# Patient Record
Sex: Female | Born: 1955 | Race: White | Hispanic: No | Marital: Married | Smoking: Former smoker
Health system: Southern US, Community
[De-identification: ages and names within clinical notes are randomized; demographics above are authoritative.]

## PROBLEM LIST (undated history)

## (undated) DIAGNOSIS — E079 Disorder of thyroid, unspecified: Secondary | ICD-10-CM

## (undated) DIAGNOSIS — R5382 Chronic fatigue, unspecified: Secondary | ICD-10-CM

## (undated) DIAGNOSIS — R5383 Other fatigue: Secondary | ICD-10-CM

## (undated) DIAGNOSIS — M810 Age-related osteoporosis without current pathological fracture: Secondary | ICD-10-CM

## (undated) DIAGNOSIS — F32A Depression, unspecified: Secondary | ICD-10-CM

## (undated) DIAGNOSIS — H8109 Meniere's disease, unspecified ear: Secondary | ICD-10-CM

## (undated) DIAGNOSIS — G4700 Insomnia, unspecified: Secondary | ICD-10-CM

## (undated) DIAGNOSIS — F329 Major depressive disorder, single episode, unspecified: Secondary | ICD-10-CM

## (undated) DIAGNOSIS — N879 Dysplasia of cervix uteri, unspecified: Secondary | ICD-10-CM

## (undated) DIAGNOSIS — G43909 Migraine, unspecified, not intractable, without status migrainosus: Secondary | ICD-10-CM

## (undated) DIAGNOSIS — K635 Polyp of colon: Secondary | ICD-10-CM

## (undated) HISTORY — DX: Insomnia, unspecified: G47.00

## (undated) HISTORY — DX: Polyp of colon: K63.5

## (undated) HISTORY — PX: LEEP: SHX91

## (undated) HISTORY — DX: Dysplasia of cervix uteri, unspecified: N87.9

## (undated) HISTORY — DX: Other fatigue: R53.83

## (undated) HISTORY — PX: DENTAL SURGERY: SHX609

## (undated) HISTORY — DX: Age-related osteoporosis without current pathological fracture: M81.0

## (undated) HISTORY — DX: Chronic fatigue, unspecified: R53.82

---

## 2009-03-16 ENCOUNTER — Emergency Department (HOSPITAL_BASED_OUTPATIENT_CLINIC_OR_DEPARTMENT_OTHER): Admission: EM | Admit: 2009-03-16 | Discharge: 2009-03-16 | Payer: Self-pay | Admitting: Emergency Medicine

## 2011-05-11 ENCOUNTER — Other Ambulatory Visit (HOSPITAL_BASED_OUTPATIENT_CLINIC_OR_DEPARTMENT_OTHER): Payer: Self-pay | Admitting: Family Medicine

## 2011-05-11 DIAGNOSIS — Z1231 Encounter for screening mammogram for malignant neoplasm of breast: Secondary | ICD-10-CM

## 2011-05-13 ENCOUNTER — Ambulatory Visit (HOSPITAL_BASED_OUTPATIENT_CLINIC_OR_DEPARTMENT_OTHER)
Admission: RE | Admit: 2011-05-13 | Discharge: 2011-05-13 | Disposition: A | Discharge status: Home / Self Care | Payer: BC Managed Care – PPO | Source: Ambulatory Visit | Attending: Family Medicine | Admitting: Family Medicine

## 2011-05-13 DIAGNOSIS — Z1231 Encounter for screening mammogram for malignant neoplasm of breast: Secondary | ICD-10-CM | POA: Insufficient documentation

## 2012-01-12 ENCOUNTER — Encounter (HOSPITAL_BASED_OUTPATIENT_CLINIC_OR_DEPARTMENT_OTHER): Payer: Self-pay | Admitting: Emergency Medicine

## 2012-01-12 ENCOUNTER — Emergency Department (HOSPITAL_BASED_OUTPATIENT_CLINIC_OR_DEPARTMENT_OTHER)
Admission: EM | Admit: 2012-01-12 | Discharge: 2012-01-12 | Disposition: A | Discharge status: Home / Self Care | Payer: BC Managed Care – PPO | Attending: Emergency Medicine | Admitting: Emergency Medicine

## 2012-01-12 DIAGNOSIS — Z23 Encounter for immunization: Secondary | ICD-10-CM | POA: Insufficient documentation

## 2012-01-12 DIAGNOSIS — F3289 Other specified depressive episodes: Secondary | ICD-10-CM | POA: Insufficient documentation

## 2012-01-12 DIAGNOSIS — E079 Disorder of thyroid, unspecified: Secondary | ICD-10-CM | POA: Insufficient documentation

## 2012-01-12 DIAGNOSIS — Z87891 Personal history of nicotine dependence: Secondary | ICD-10-CM | POA: Insufficient documentation

## 2012-01-12 DIAGNOSIS — Z79899 Other long term (current) drug therapy: Secondary | ICD-10-CM | POA: Insufficient documentation

## 2012-01-12 DIAGNOSIS — F329 Major depressive disorder, single episode, unspecified: Secondary | ICD-10-CM | POA: Insufficient documentation

## 2012-01-12 DIAGNOSIS — H53149 Visual discomfort, unspecified: Secondary | ICD-10-CM | POA: Insufficient documentation

## 2012-01-12 DIAGNOSIS — H18829 Corneal disorder due to contact lens, unspecified eye: Secondary | ICD-10-CM | POA: Insufficient documentation

## 2012-01-12 HISTORY — DX: Depression, unspecified: F32.A

## 2012-01-12 HISTORY — DX: Major depressive disorder, single episode, unspecified: F32.9

## 2012-01-12 HISTORY — DX: Disorder of thyroid, unspecified: E07.9

## 2012-01-12 MED ORDER — HYDROCODONE-ACETAMINOPHEN 5-500 MG PO TABS: 1.0000 | ORAL_TABLET | Freq: Four times a day (QID) | ORAL | Status: DC | PRN

## 2012-01-12 MED ORDER — FLUORESCEIN SODIUM 1 MG OP STRP
ORAL_STRIP | OPHTHALMIC | Status: AC
  Administered 2012-01-12: 1 via OPHTHALMIC
  Filled 2012-01-12: qty 1

## 2012-01-12 MED ORDER — TETANUS-DIPHTH-ACELL PERTUSSIS 5-2.5-18.5 LF-MCG/0.5 IM SUSP
0.5000 mL | Freq: Once | INTRAMUSCULAR | Status: AC
  Administered 2012-01-12: 0.5 mL via INTRAMUSCULAR
  Filled 2012-01-12: qty 0.5

## 2012-01-12 MED ORDER — TETRACAINE HCL 0.5 % OP SOLN
1.0000 [drp] | Freq: Once | OPHTHALMIC | Status: AC
  Administered 2012-01-12: 1 [drp] via OPHTHALMIC
  Filled 2012-01-12: qty 2

## 2012-01-12 MED ORDER — CIPROFLOXACIN HCL 0.3 % OP SOLN: 2.0000 [drp] | OPHTHALMIC | Status: DC

## 2012-01-12 MED ORDER — FLUORESCEIN SODIUM 1 MG OP STRP
1.0000 | ORAL_STRIP | Freq: Once | OPHTHALMIC | Status: AC
  Administered 2012-01-12: 1 via OPHTHALMIC

## 2012-01-12 NOTE — ED Notes (Signed)
rx x 2 given for hydrocodone and cipro eye drops- note for work given per vorb from [facility] [place]

## 2012-01-12 NOTE — ED Notes (Signed)
Pt states she feels like something is in her eye, pt reports wearing contacts, took contacts out and went to bed and she awoke with severe eye pain

## 2012-01-12 NOTE — ED Provider Notes (Addendum)
History     CSN: [id number]  Arrival date & time 01/12/12  0135   First MD Initiated Contact with Patient 01/12/12 0159      Chief Complaint  Patient presents with  . Eye Pain    (Consider location/radiation/quality/duration/timing/severity/associated sxs/prior treatment) Patient is a 56 y.o. female presenting with eye pain and eye problem. The history is provided by the patient. No language interpreter was used.  Eye Pain This is a new problem. The current episode started 3 to 5 hours ago. The problem occurs constantly. The problem has not changed since onset.Pertinent negatives include no chest pain, no abdominal pain, no headaches and no shortness of breath. Nothing aggravates the symptoms. She has tried nothing for the symptoms. The treatment provided no relief.  Eye Problem  This is a new problem. The current episode started 3 to 5 hours ago. The problem occurs constantly. The problem has not changed since onset.The left eye is affected.Injury mechanism: pulled contact lens off now has pain and foreign body sensation. The pain is severe. There is no history of trauma to the eye. There is no known exposure to pink eye. She wears contacts. Associated symptoms include photophobia. Pertinent negatives include no blurred vision, no decreased vision, no discharge, no double vision, no eye redness, no nausea, no tingling and no weakness. She has tried nothing for the symptoms. The treatment provided no relief.    Past Medical History  Diagnosis Date  . Thyroid disease   . Depression     History reviewed. No pertinent past surgical history.  History reviewed. No pertinent family history.  History  Substance Use Topics  . Smoking status: Former Games developer  . Smokeless tobacco: Not on file  . Alcohol Use: No    OB History    Grav Para Term Preterm Abortions TAB SAB Ect Mult Living                  Review of Systems  Constitutional: Negative for fever.  Eyes: Positive for  photophobia and pain. Negative for blurred vision, double vision, discharge, redness and visual disturbance.  Respiratory: Negative for shortness of breath.   Cardiovascular: Negative for chest pain.  Gastrointestinal: Negative for nausea and abdominal pain.  Neurological: Negative for tingling, weakness and headaches.  All other systems reviewed and are negative.    Allergies  Erythromycin  Home Medications   Current Outpatient Rx  Name  Route  Sig  Dispense  Refill  . FLUOXETINE HCL 10 MG PO CAPS   Oral   Take 10 mg by mouth daily.         Marland Kitchen LEVOTHYROXINE SODIUM 100 MCG PO TABS   Oral   Take 100 mcg by mouth daily.           BP 148/88  Pulse 65  Temp 98.3 F (36.8 C) (Oral)  Resp 14  Ht 5\' 2"  (1.575 m)  Wt 158 lb (71.668 kg)  BMI 28.90 kg/m2  SpO2 100%  Physical Exam  Constitutional: She is oriented to person, place, and time. She appears well-developed and well-nourished. No distress.  HENT:  Head: Normocephalic and atraumatic.  Right Ear: Tympanic membrane is not injected.  Left Ear: Tympanic membrane is not injected.  Mouth/Throat: Oropharynx is clear and moist.  Eyes: Pupils are equal, round, and reactive to light. Right eye exhibits no chemosis, no discharge and no exudate. No foreign body present in the right eye. Left eye exhibits no chemosis, no discharge and no exudate.  No foreign body present in the left eye. Right conjunctiva is not injected. Right conjunctiva has no hemorrhage. Left conjunctiva is not injected. Left conjunctiva has no hemorrhage. Right eye exhibits normal extraocular motion. Left eye exhibits abnormal extraocular motion. Right pupil is reactive. Left pupil is reactive.  Slit lamp exam:      The left eye shows corneal abrasion. The left eye shows no corneal ulcer.    Neck: Normal range of motion.  Cardiovascular: Normal rate and regular rhythm.   Pulmonary/Chest: Effort normal and breath sounds normal.  Abdominal: Soft. Bowel sounds  are normal. There is no tenderness.  Musculoskeletal: Normal range of motion.  Lymphadenopathy:    She has no cervical adenopathy.  Neurological: She is alert and oriented to person, place, and time.  Skin: Skin is warm and dry.  Psychiatric: She has a normal mood and affect.    ED Course  Procedures (including critical care time)  Labs Reviewed - No data to display No results found.   No diagnosis found.    MDM  Will prescribe antibiotics follow up today with ophtholomology no contact lenses patient verbalizes understanding and agrees to follow up        Arayah Krouse K Charika Mikelson-Rasch, MD 01/12/12 0239  Lolly Glaus K Brynnlee Cumpian-Rasch, MD 01/12/12 [phone]

## 2012-01-20 LAB — HM COLONOSCOPY

## 2012-07-21 ENCOUNTER — Other Ambulatory Visit: Payer: Self-pay | Admitting: Family Medicine

## 2012-07-21 ENCOUNTER — Other Ambulatory Visit: Payer: Self-pay | Admitting: *Deleted

## 2012-07-21 DIAGNOSIS — E785 Hyperlipidemia, unspecified: Secondary | ICD-10-CM

## 2012-07-21 DIAGNOSIS — E559 Vitamin D deficiency, unspecified: Secondary | ICD-10-CM

## 2012-07-21 DIAGNOSIS — Z1231 Encounter for screening mammogram for malignant neoplasm of breast: Secondary | ICD-10-CM

## 2012-07-21 DIAGNOSIS — E039 Hypothyroidism, unspecified: Secondary | ICD-10-CM

## 2012-07-25 ENCOUNTER — Other Ambulatory Visit: Payer: BC Managed Care – PPO

## 2012-07-25 LAB — CBC WITH DIFFERENTIAL/PLATELET
Basophils Absolute: 0 10*3/uL (ref 0.0–0.1)
Basophils Relative: 1 % (ref 0–1)
Eosinophils Absolute: 0.2 10*3/uL (ref 0.0–0.7)
Eosinophils Relative: 3 % (ref 0–5)
HCT: 41.6 % (ref 36.0–46.0)
Hemoglobin: 14.7 g/dL (ref 12.0–15.0)
Lymphocytes Relative: 49 % — ABNORMAL HIGH (ref 12–46)
Lymphs Abs: 3.3 10*3/uL (ref 0.7–4.0)
MCH: 30.8 pg (ref 26.0–34.0)
MCHC: 35.3 g/dL (ref 30.0–36.0)
MCV: 87.2 fL (ref 78.0–100.0)
Monocytes Absolute: 0.5 10*3/uL (ref 0.1–1.0)
Monocytes Relative: 7 % (ref 3–12)
Neutro Abs: 2.8 10*3/uL (ref 1.7–7.7)
Neutrophils Relative %: 40 % — ABNORMAL LOW (ref 43–77)
Platelets: 348 10*3/uL (ref 150–400)
RBC: 4.77 MIL/uL (ref 3.87–5.11)
RDW: 13 % (ref 11.5–15.5)
WBC: 6.9 10*3/uL (ref 4.0–10.5)

## 2012-07-25 LAB — TSH: TSH: 0.467 u[IU]/mL (ref 0.350–4.500)

## 2012-07-25 LAB — COMPLETE METABOLIC PANEL WITH GFR
ALT: 13 U/L (ref 0–35)
AST: 15 U/L (ref 0–37)
Albumin: 4.1 g/dL (ref 3.5–5.2)
Alkaline Phosphatase: 73 U/L (ref 39–117)
BUN: 16 mg/dL (ref 6–23)
CO2: 24 mEq/L (ref 19–32)
Calcium: 9.6 mg/dL (ref 8.4–10.5)
Chloride: 106 mEq/L (ref 96–112)
Creat: 1.14 mg/dL — ABNORMAL HIGH (ref 0.50–1.10)
GFR, Est African American: 63 mL/min
GFR, Est Non African American: 54 mL/min — ABNORMAL LOW
Glucose, Bld: 105 mg/dL — ABNORMAL HIGH (ref 70–99)
Potassium: 4.4 mEq/L (ref 3.5–5.3)
Sodium: 139 mEq/L (ref 135–145)
Total Bilirubin: 0.3 mg/dL (ref 0.3–1.2)
Total Protein: 7 g/dL (ref 6.0–8.3)

## 2012-07-25 LAB — LIPID PANEL
Cholesterol: 171 mg/dL (ref 0–200)
HDL: 48 mg/dL (ref >39)
LDL Cholesterol: 108 mg/dL — ABNORMAL HIGH (ref 0–99)
Total CHOL/HDL Ratio: 3.6 Ratio
Triglycerides: 75 mg/dL (ref <150)
VLDL: 15 mg/dL (ref 0–40)

## 2012-07-25 LAB — T4, FREE: Free T4: 1.64 ng/dL (ref 0.80–1.80)

## 2012-07-26 LAB — VITAMIN D 25 HYDROXY (VIT D DEFICIENCY, FRACTURES): Vit D, 25-Hydroxy: 33 ng/mL (ref 30–89)

## 2012-07-27 ENCOUNTER — Ambulatory Visit (INDEPENDENT_AMBULATORY_CARE_PROVIDER_SITE_OTHER): Payer: BC Managed Care – PPO | Admitting: Family Medicine

## 2012-07-27 ENCOUNTER — Encounter: Payer: Self-pay | Admitting: Family Medicine

## 2012-07-27 VITALS — BP 120/77 | HR 73 | Wt 165.0 lb

## 2012-07-27 DIAGNOSIS — E785 Hyperlipidemia, unspecified: Secondary | ICD-10-CM

## 2012-07-27 DIAGNOSIS — F3289 Other specified depressive episodes: Secondary | ICD-10-CM

## 2012-07-27 DIAGNOSIS — K219 Gastro-esophageal reflux disease without esophagitis: Secondary | ICD-10-CM

## 2012-07-27 DIAGNOSIS — E039 Hypothyroidism, unspecified: Secondary | ICD-10-CM

## 2012-07-27 DIAGNOSIS — F329 Major depressive disorder, single episode, unspecified: Secondary | ICD-10-CM

## 2012-07-27 DIAGNOSIS — IMO0001 Reserved for inherently not codable concepts without codable children: Secondary | ICD-10-CM

## 2012-07-27 DIAGNOSIS — R7301 Impaired fasting glucose: Secondary | ICD-10-CM

## 2012-07-27 DIAGNOSIS — F411 Generalized anxiety disorder: Secondary | ICD-10-CM

## 2012-07-27 MED ORDER — LEVOTHYROXINE SODIUM 150 MCG PO TABS: 150.0000 ug | ORAL_TABLET | Freq: Every day | ORAL | Status: AC

## 2012-07-27 MED ORDER — BUPROPION HCL ER (SR) 150 MG PO TB12: 150.0000 mg | ORAL_TABLET | Freq: Two times a day (BID) | ORAL | Status: AC

## 2012-07-27 MED ORDER — HYDROXYZINE PAMOATE 25 MG PO CAPS: ORAL_CAPSULE | ORAL | Status: DC

## 2012-07-27 MED ORDER — FLUOXETINE HCL 10 MG PO CAPS: 20.0000 mg | ORAL_CAPSULE | Freq: Every day | ORAL | Status: DC

## 2012-07-27 MED ORDER — CYCLOBENZAPRINE HCL 10 MG PO TABS: ORAL_TABLET | ORAL | Status: AC

## 2012-07-27 MED ORDER — RANITIDINE HCL 150 MG PO TABS: 150.0000 mg | ORAL_TABLET | Freq: Two times a day (BID) | ORAL | Status: DC

## 2012-07-27 MED ORDER — METFORMIN HCL ER 500 MG PO TB24: ORAL_TABLET | ORAL | Status: DC

## 2012-07-27 NOTE — Patient Instructions (Addendum)
1)  Fatigue - Your thyroid level is perfect so stay on the 150 mcg. Add some Wellbutrin SR 150 in the am for 1 week then increase to 150 twice a day (remember to not take it too late it may keep you up).  2)  Blood Sugar - It is a little elevated. You can try the Metformin 500 -1000 mg at night.    3)  Mood - Stay on the 20 mg of Prozac and see how you feel with the Wellbutrin.  4)  50 Shades of Grey :)  Get Loraine Leriche to listen to it with you !!   5) Cholesterol - Your LDL is a little high BUT much better than before.  Try the smoothie (Vanilla Protein Powder, Almond/Skim/Soy milk, fruit and veggie cubes and avocado)   Fatigue Fatigue is a feeling of tiredness, lack of energy, lack of motivation, or feeling tired all the time. Having enough rest, good nutrition, and reducing stress will normally reduce fatigue. Consult your caregiver if it persists. The nature of your fatigue will help your caregiver to find out its cause. The treatment is based on the cause.  CAUSES  There are many causes for fatigue. Most of the time, fatigue can be traced to one or more of your habits or routines. Most causes fit into one or more of three general areas. They are: Lifestyle problems  Sleep disturbances.  Overwork.  Physical exertion.  Unhealthy habits.  Poor eating habits or eating disorders.  Alcohol and/or drug use .  Lack of proper nutrition (malnutrition). Psychological problems  Stress and/or anxiety problems.  Depression.  Grief.  Boredom. Medical Problems or Conditions  Anemia.  Pregnancy.  Thyroid gland problems.  Recovery from major surgery.  Continuous pain.  Emphysema or asthma that is not well controlled  Allergic conditions.  Diabetes.  Infections (such as mononucleosis).  Obesity.  Sleep disorders, such as sleep apnea.  Heart failure or other heart-related problems.  Cancer.  Kidney disease.  Liver disease.  Effects of certain medicines such as  antihistamines, cough and cold remedies, prescription pain medicines, heart and blood pressure medicines, drugs used for treatment of cancer, and some antidepressants. SYMPTOMS  The symptoms of fatigue include:   Lack of energy.  Lack of drive (motivation).  Drowsiness.  Feeling of indifference to the surroundings. DIAGNOSIS  The details of how you feel help guide your caregiver in finding out what is causing the fatigue. You will be asked about your present and past health condition. It is important to review all medicines that you take, including prescription and non-prescription items. A thorough exam will be done. You will be questioned about your feelings, habits, and normal lifestyle. Your caregiver may suggest blood tests, urine tests, or other tests to look for common medical causes of fatigue.  TREATMENT  Fatigue is treated by correcting the underlying cause. For example, if you have continuous pain or depression, treating these causes will improve how you feel. Similarly, adjusting the dose of certain medicines will help in reducing fatigue.  HOME CARE INSTRUCTIONS   Try to get the required amount of good sleep every night.  Eat a healthy and nutritious diet, and drink enough water throughout the day.  Practice ways of relaxing (including yoga or meditation).  Exercise regularly.  Make plans to change situations that cause stress. Act on those plans so that stresses decrease over time. Keep your work and personal routine reasonable.  Avoid street drugs and minimize use of alcohol.  Start taking a daily multivitamin after consulting your caregiver. SEEK MEDICAL CARE IF:   You have persistent tiredness, which cannot be accounted for.  You have fever.  You have unintentional weight loss.  You have headaches.  You have disturbed sleep throughout the night.  You are feeling sad.  You have constipation.  You have dry skin.  You have gained weight.  You are taking  any new or different medicines that you suspect are causing fatigue.  You are unable to sleep at night.  You develop any unusual swelling of your legs or other parts of your body. SEEK IMMEDIATE MEDICAL CARE IF:   You are feeling confused.  Your vision is blurred.  You feel faint or pass out.  You develop severe headache.  You develop severe abdominal, pelvic, or back pain.  You develop chest pain, shortness of breath, or an irregular or fast heartbeat.  You are unable to pass a normal amount of urine.  You develop abnormal bleeding such as bleeding from the rectum or you vomit blood.  You have thoughts about harming yourself or committing suicide.  You are worried that you might harm someone else. MAKE SURE YOU:   Understand these instructions.  Will watch your condition.  Will get help right away if you are not doing well or get worse. Document Released: 11/02/2006 Document Revised: 03/30/2011 Document Reviewed: 11/02/2006 [facility] Patient Information 2014 [place], [place].

## 2012-07-27 NOTE — Progress Notes (Signed)
  Subjective:    Patient ID: Jodi Jones, female    DOB: Mar 19, 1955, 57 y.o.   MRN: [id number]  HPI  Jodi Jones is here to follow up on her labs, get medication refills and discuss the following conditions listed below:  1)  Hypothyroidism:  She has been feeling very fatigued lately.    2)  Hyperlipidemia:  She has been trying to control her cholesterol with diet.    3)  Mood: Her mood is stable on Prozac.   4)  GERD:  She needs to have her Zantac refilled.      Review of Systems  Constitutional: Positive for fatigue. Negative for activity change and unexpected weight change.  HENT: Negative.   Eyes: Negative.   Respiratory: Negative for shortness of breath.   Cardiovascular: Negative for chest pain, palpitations and leg swelling.  Gastrointestinal: Negative for diarrhea and constipation.  Endocrine: Negative.   Genitourinary: Negative for difficulty urinating.  Musculoskeletal: Negative.   Skin: Negative.   Neurological: Negative.   Hematological: Negative for adenopathy. Does not bruise/bleed easily.  Psychiatric/Behavioral: Negative for sleep disturbance and dysphoric mood. The patient is not nervous/anxious.    Past Medical History  Diagnosis Date  . Thyroid disease   . Depression   . Fatigue   . Chronic fatigue   . Insomnia   . Colon polyps   . Cervical dysplasia   . Osteoporosis    Family History  Problem Relation Age of Onset  . Alzheimer's disease Mother   . Alzheimer's disease Paternal Grandfather    History   Social History Narrative   Marital Status: Married Government social research officer)   Children:  G 3 P2012   Pets: Cats (3)   Living Situation: Lives with her spouse, daughter, and father- in- law     Occupation:  [employer]    Education: Engineer, agricultural; She is currently enrolled in a math class at [employer].     Tobacco Use/Exposure:  None    Alcohol Use:  Occasional   Drug Use:  None   Diet:  Regular   Exercise:  Limited    Hobbies:   Gardening, Knitting, Crocheting                  Objective:   Physical Exam  Constitutional: She appears well-nourished. No distress.  HENT:  Head: Normocephalic.  Eyes: No scleral icterus.  Neck: No thyromegaly present.  Cardiovascular: Normal rate, regular rhythm and normal heart sounds.   Pulmonary/Chest: Effort normal and breath sounds normal.  Abdominal: There is no tenderness.  Musculoskeletal: She exhibits no edema and no tenderness.  Neurological: She is alert.  Skin: Skin is warm and dry.  Psychiatric: She has a normal mood and affect. Her behavior is normal. Judgment and thought content normal.          Assessment & Plan:

## 2012-08-09 ENCOUNTER — Other Ambulatory Visit: Payer: BC Managed Care – PPO | Admitting: Family Medicine

## 2012-08-10 ENCOUNTER — Encounter: Payer: Self-pay | Admitting: Family Medicine

## 2012-08-10 ENCOUNTER — Ambulatory Visit (INDEPENDENT_AMBULATORY_CARE_PROVIDER_SITE_OTHER): Payer: BC Managed Care – PPO | Admitting: Family Medicine

## 2012-08-10 VITALS — BP 115/78 | HR 87 | Temp 98.3°F | Resp 17 | Ht 61.5 in | Wt 161.0 lb

## 2012-08-10 DIAGNOSIS — R05 Cough: Secondary | ICD-10-CM

## 2012-08-10 DIAGNOSIS — IMO0001 Reserved for inherently not codable concepts without codable children: Secondary | ICD-10-CM

## 2012-08-10 DIAGNOSIS — R509 Fever, unspecified: Secondary | ICD-10-CM

## 2012-08-10 DIAGNOSIS — R059 Cough, unspecified: Secondary | ICD-10-CM

## 2012-08-10 DIAGNOSIS — R070 Pain in throat: Secondary | ICD-10-CM

## 2012-08-10 LAB — CBC WITH DIFFERENTIAL/PLATELET
Basophils Absolute: 0 10*3/uL (ref 0.0–0.1)
Basophils Relative: 1 % (ref 0–1)
Eosinophils Absolute: 0.2 10*3/uL (ref 0.0–0.7)
Eosinophils Relative: 2 % (ref 0–5)
HCT: 44.4 % (ref 36.0–46.0)
Hemoglobin: 15.3 g/dL — ABNORMAL HIGH (ref 12.0–15.0)
Lymphocytes Relative: 41 % (ref 12–46)
Lymphs Abs: 2.8 10*3/uL (ref 0.7–4.0)
MCH: 31 pg (ref 26.0–34.0)
MCHC: 34.5 g/dL (ref 30.0–36.0)
MCV: 90.1 fL (ref 78.0–100.0)
Monocytes Absolute: 0.4 10*3/uL (ref 0.1–1.0)
Monocytes Relative: 6 % (ref 3–12)
Neutro Abs: 3.4 10*3/uL (ref 1.7–7.7)
Neutrophils Relative %: 50 % (ref 43–77)
Platelets: 369 10*3/uL (ref 150–400)
RBC: 4.93 MIL/uL (ref 3.87–5.11)
RDW: 12.8 % (ref 11.5–15.5)
WBC: 6.9 10*3/uL (ref 4.0–10.5)

## 2012-08-10 LAB — POCT RAPID STREP A (OFFICE): Rapid Strep A Screen: NEGATIVE

## 2012-08-10 MED ORDER — HYDROCODONE-HOMATROPINE 5-1.5 MG/5ML PO SYRP: ORAL_SOLUTION | ORAL | Status: DC

## 2012-08-10 MED ORDER — BENZONATATE 200 MG PO CAPS: 200.0000 mg | ORAL_CAPSULE | Freq: Three times a day (TID) | ORAL | Status: DC | PRN

## 2012-08-10 MED ORDER — CEFDINIR 300 MG PO CAPS: 300.0000 mg | ORAL_CAPSULE | Freq: Two times a day (BID) | ORAL | Status: DC

## 2012-08-10 NOTE — Progress Notes (Signed)
  Subjective:    Patient ID: Jodi Jones, female    DOB: 07/10/1955, 57 y.o.   MRN: [id number]   HPI  Jodi Jones is here today complaining of multiple symptoms. She has been feeling poorly for a couple of days.  She has a severe sore throat, head congestion, back pain and generalized muscle aches.  She feels like she might have the flu.  She describes her symptoms as being moderate in nature and she feels that she is worsening.  She has been taking an antihistamine.     Review of Systems  Constitutional: Positive for fever, chills, diaphoresis and fatigue.  HENT: Positive for ear pain, congestion, sore throat, rhinorrhea, voice change and sinus pressure.   Eyes: Negative for pain.  Respiratory: Positive for cough and chest tightness. Negative for shortness of breath.   Cardiovascular: Negative.   Gastrointestinal: Negative.   Genitourinary: Negative for difficulty urinating.  Musculoskeletal: Positive for myalgias and back pain.  Skin: Negative for rash.  Neurological: Negative.   Psychiatric/Behavioral: Negative.    Past Medical History  Diagnosis Date  . Thyroid disease   . Depression   . Fatigue    Family History  Problem Relation Age of Onset  . Alzheimer's disease Mother   . Alzheimer's disease Paternal Grandfather     History   Social History Narrative   Marital Status: Married Government social research officer)   Children:  G 3 P2012   Pets: Cats (3)   Living Situation: Lives with her spouse, daughter, and father- in- law     Occupation:  [employer]    Education: Engineer, agricultural; She is currently enrolled in a math class at [employer].     Tobacco Use/Exposure:  None    Alcohol Use:  Occasional   Drug Use:  None   Diet:  Regular   Exercise:  Limited    Hobbies:   Gardening, Knitting, Crocheting                   Objective:   Physical Exam  Constitutional:  Ill-appearing  HENT:  Mouth/Throat: No oropharyngeal exudate.  Eyes: Conjunctivae are normal. No scleral icterus.  Neck: Neck  supple. No thyromegaly present.  Cardiovascular: Normal rate, regular rhythm and normal heart sounds.   Pulmonary/Chest: Effort normal and breath sounds normal. No respiratory distress.  Abdominal: Soft. She exhibits no mass. There is no tenderness.  Lymphadenopathy:    She has no cervical adenopathy.  Neurological: She is alert.  Skin: No rash noted.  Psychiatric: She has a normal mood and affect. Her behavior is normal. Judgment and thought content normal.          Assessment & Plan:

## 2012-08-12 LAB — URINE CULTURE: Colony Count: >=100000

## 2012-08-28 ENCOUNTER — Encounter: Payer: Self-pay | Admitting: Family Medicine

## 2012-08-28 DIAGNOSIS — F411 Generalized anxiety disorder: Secondary | ICD-10-CM | POA: Insufficient documentation

## 2012-08-28 DIAGNOSIS — E785 Hyperlipidemia, unspecified: Secondary | ICD-10-CM | POA: Insufficient documentation

## 2012-08-28 DIAGNOSIS — IMO0001 Reserved for inherently not codable concepts without codable children: Secondary | ICD-10-CM | POA: Insufficient documentation

## 2012-08-28 DIAGNOSIS — F3289 Other specified depressive episodes: Secondary | ICD-10-CM | POA: Insufficient documentation

## 2012-08-28 DIAGNOSIS — R7301 Impaired fasting glucose: Secondary | ICD-10-CM | POA: Insufficient documentation

## 2012-08-28 DIAGNOSIS — F329 Major depressive disorder, single episode, unspecified: Secondary | ICD-10-CM | POA: Insufficient documentation

## 2012-08-28 DIAGNOSIS — K219 Gastro-esophageal reflux disease without esophagitis: Secondary | ICD-10-CM | POA: Insufficient documentation

## 2012-08-28 DIAGNOSIS — E039 Hypothyroidism, unspecified: Secondary | ICD-10-CM | POA: Insufficient documentation

## 2012-08-28 NOTE — Assessment & Plan Note (Addendum)
Her FBS is elevated.  She will start on a small dosage of metformin.  She will also work on her diet and exercise.

## 2012-08-28 NOTE — Assessment & Plan Note (Signed)
She was given prescriptions for Voltaren Gel, Flexeril and Lidoderm Patch.

## 2012-08-28 NOTE — Assessment & Plan Note (Signed)
We'll add some Wellbutrin to help her mood and her energy.

## 2012-08-28 NOTE — Assessment & Plan Note (Signed)
Refilled her Zantac.

## 2012-08-28 NOTE — Assessment & Plan Note (Signed)
Refilled her levothyroxine.

## 2012-08-28 NOTE — Assessment & Plan Note (Signed)
Refilled her Prozac and Vistaril.

## 2012-08-28 NOTE — Assessment & Plan Note (Signed)
She will try the smoothie we discussed to lower her LDL.

## 2012-08-31 ENCOUNTER — Ambulatory Visit (HOSPITAL_BASED_OUTPATIENT_CLINIC_OR_DEPARTMENT_OTHER)
Admission: RE | Admit: 2012-08-31 | Discharge: 2012-08-31 | Disposition: A | Discharge status: Home / Self Care | Payer: BC Managed Care – PPO | Source: Ambulatory Visit | Attending: Family Medicine | Admitting: Family Medicine

## 2012-08-31 DIAGNOSIS — Z1231 Encounter for screening mammogram for malignant neoplasm of breast: Secondary | ICD-10-CM | POA: Insufficient documentation

## 2012-08-31 IMAGING — MG MM DIGITAL SCREENING
4 series · 4 of 4 positions shown · non-contrast
Comparison: Previous exam(s).

CLINICAL DATA: Screening.

DIGITAL SCREENING BILATERAL MAMMOGRAM WITH CAD

[R CC]
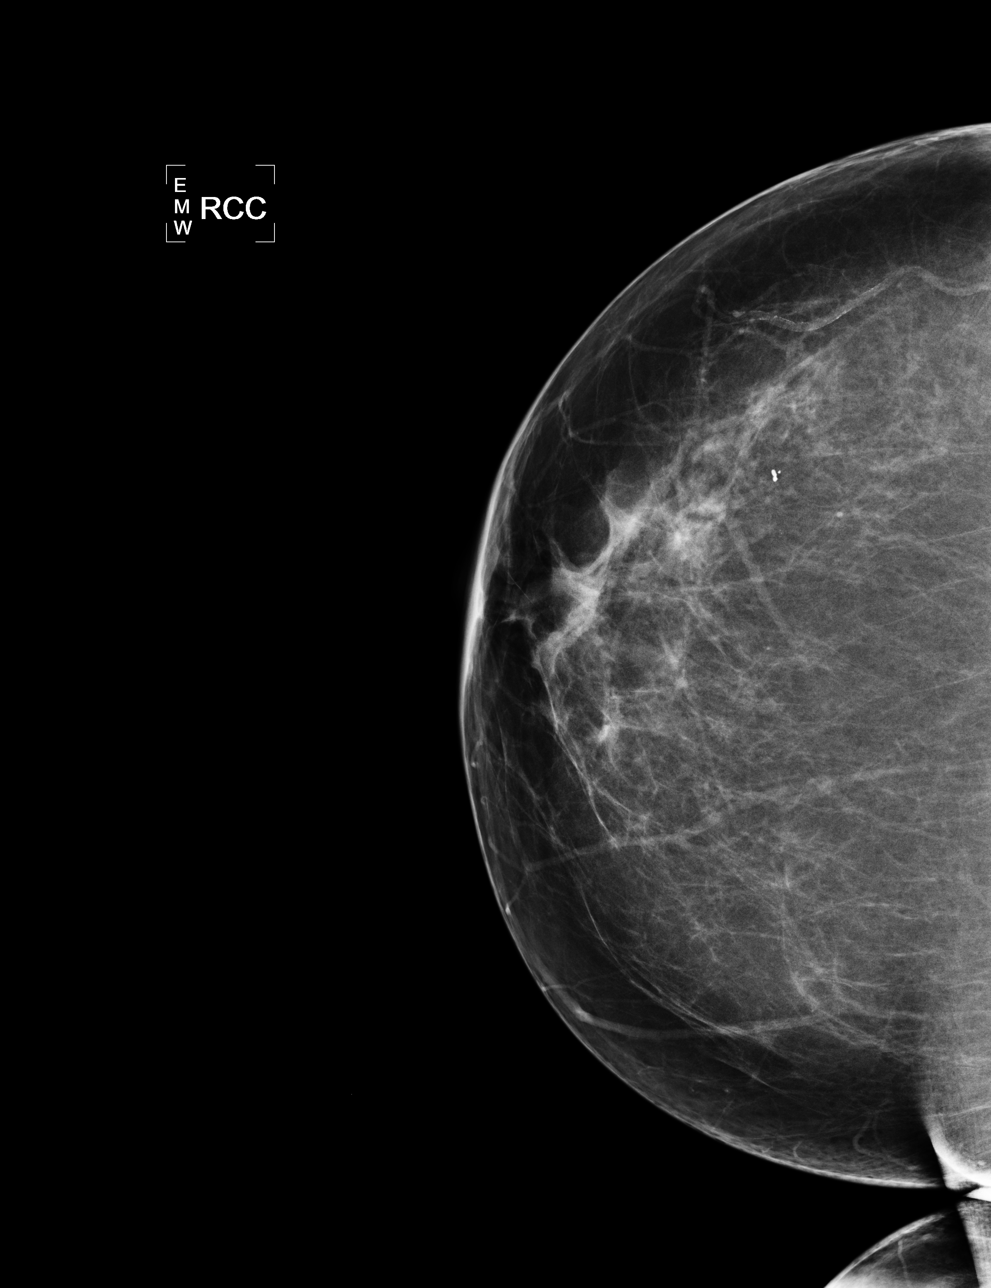

[L CC]
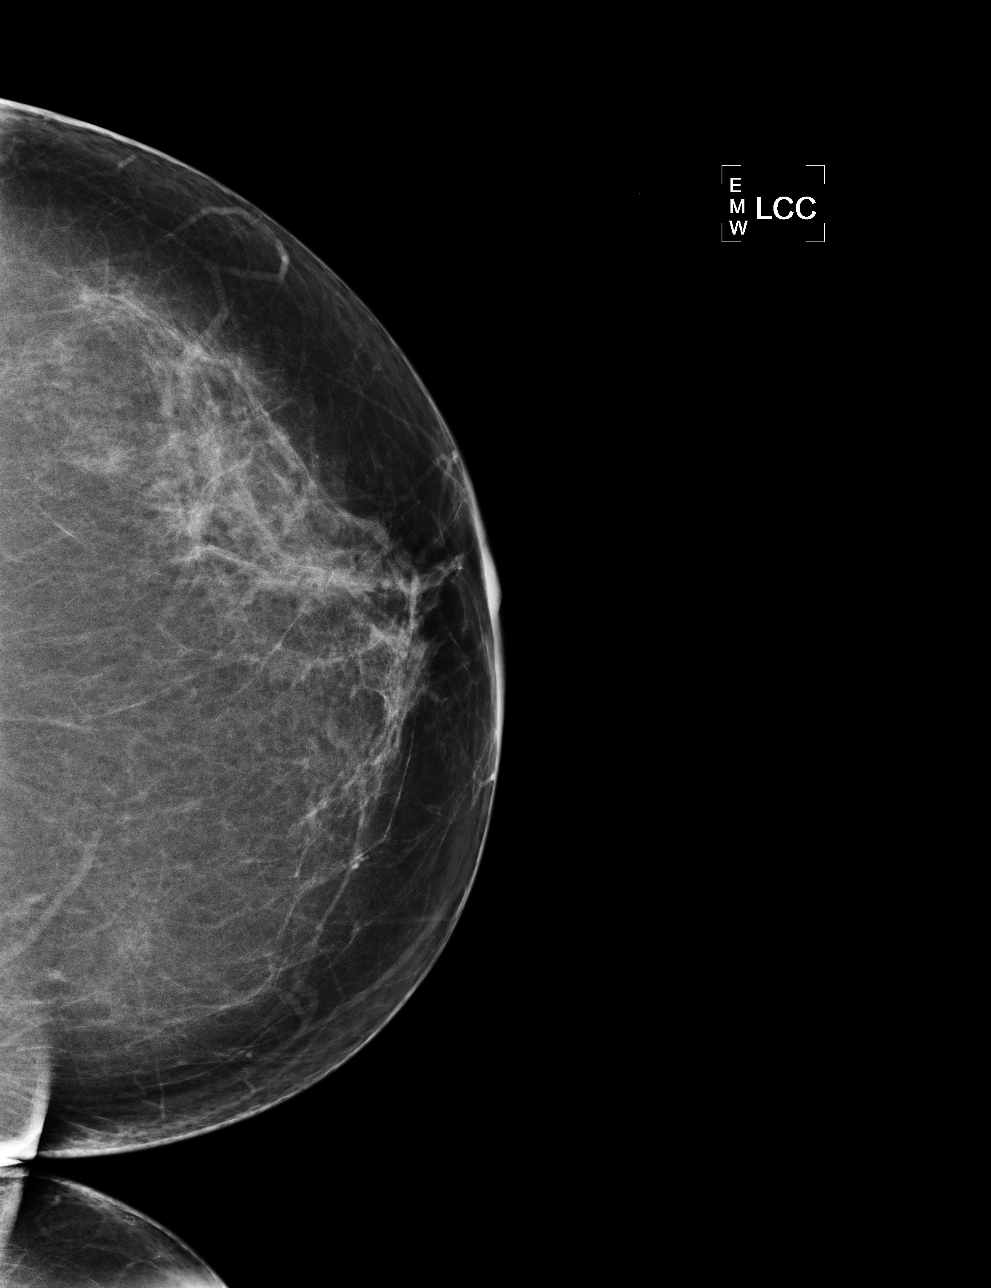

[L MLO]
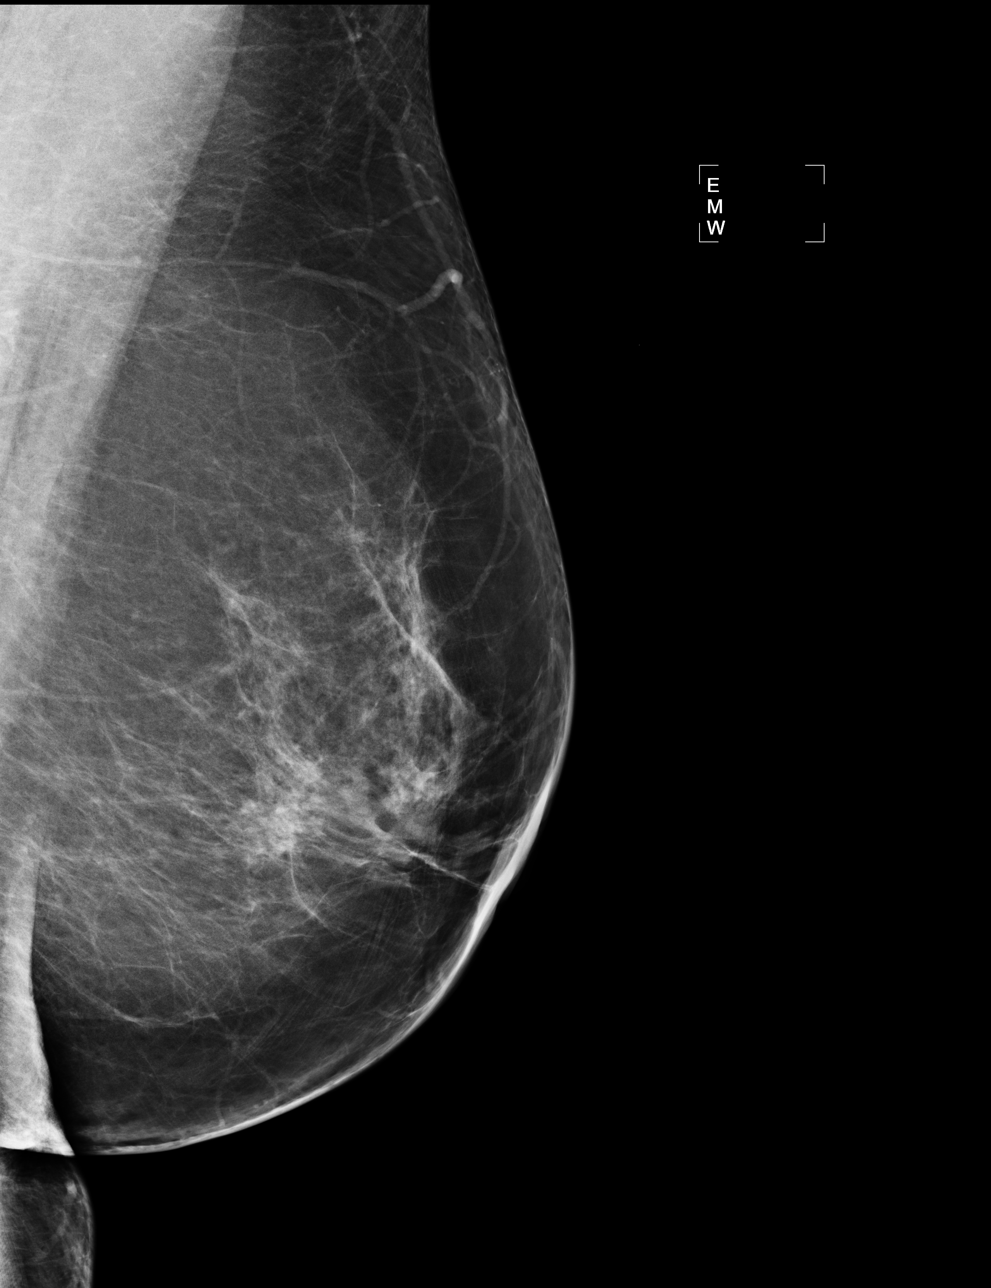

[R MLO]
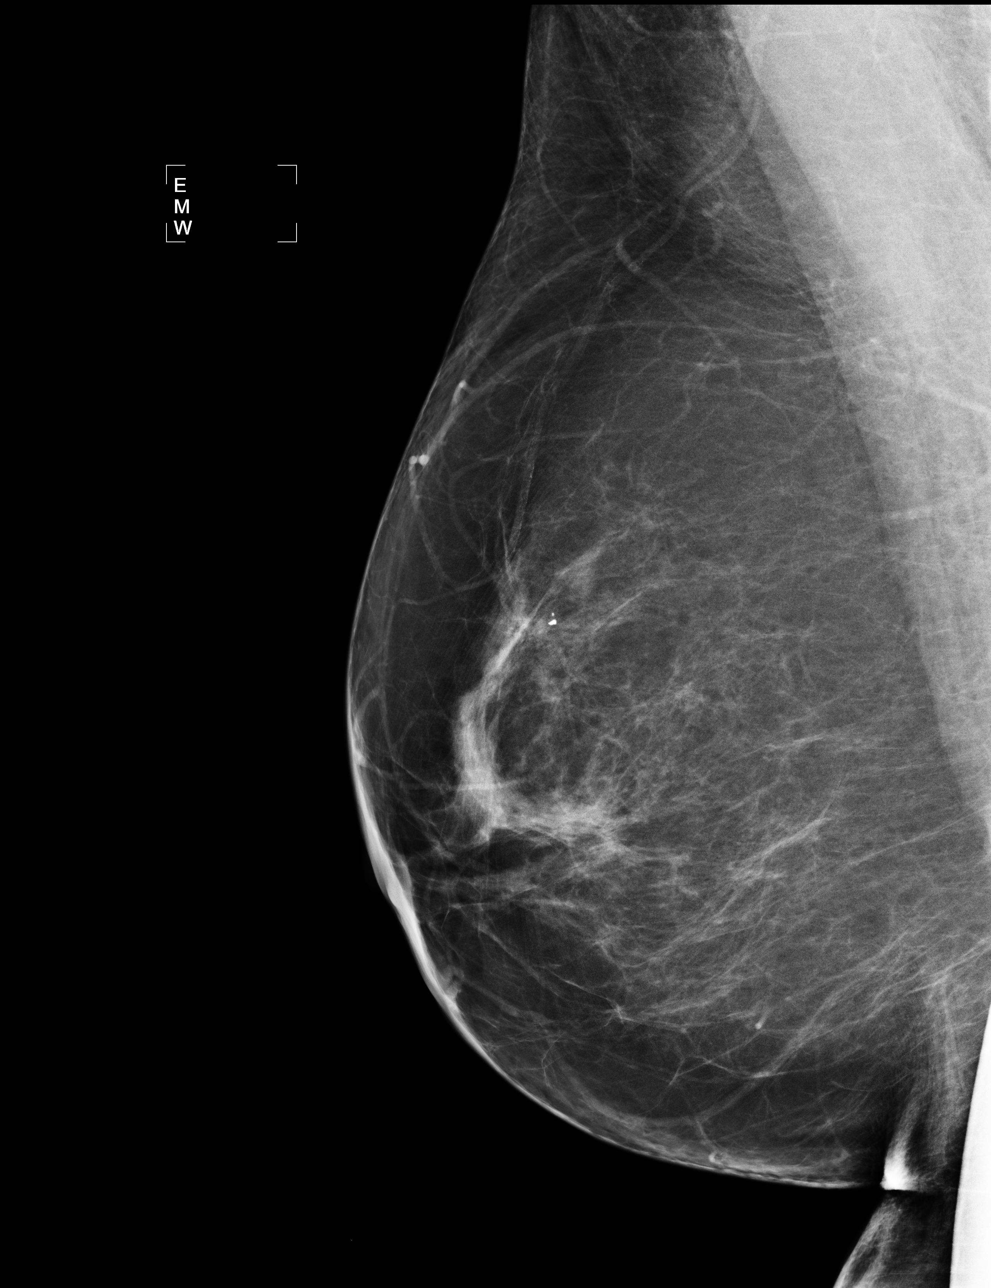

[4 of 4 positions shown; findings below may reference images not displayed]

FINDINGS: ACR Breast Density Category b:  There are scattered areas of
fibroglandular density.

There are no findings suspicious for malignancy.

Images were processed with CAD.
IMPRESSION: No mammographic evidence of malignancy.

A result letter of this screening mammogram will be mailed directly
to the patient.

RECOMMENDATION:
Screening mammogram in one year. (Code:[C3])

BI-RADS CATEGORY 1:  Negative.

## 2012-09-06 ENCOUNTER — Ambulatory Visit: Payer: BC Managed Care – PPO | Admitting: Family Medicine

## 2012-09-24 ENCOUNTER — Encounter: Payer: Self-pay | Admitting: Family Medicine

## 2012-09-24 DIAGNOSIS — R509 Fever, unspecified: Secondary | ICD-10-CM | POA: Insufficient documentation

## 2012-09-24 DIAGNOSIS — R070 Pain in throat: Secondary | ICD-10-CM | POA: Insufficient documentation

## 2012-09-24 DIAGNOSIS — R059 Cough, unspecified: Secondary | ICD-10-CM | POA: Insufficient documentation

## 2012-09-24 DIAGNOSIS — R05 Cough: Secondary | ICD-10-CM | POA: Insufficient documentation

## 2012-09-24 NOTE — Assessment & Plan Note (Signed)
She was given prescriptions for Tessalon Perles and Hycodan for her cough.   

## 2012-09-24 NOTE — Assessment & Plan Note (Signed)
Sent off a urine culture; Checking a CBC; Treated with Truman Hayward

## 2012-09-24 NOTE — Assessment & Plan Note (Signed)
Rapid Strep was negative.   

## 2012-10-13 ENCOUNTER — Encounter: Payer: BC Managed Care – PPO | Admitting: Family Medicine

## 2012-11-10 ENCOUNTER — Other Ambulatory Visit: Payer: Self-pay | Admitting: Family Medicine

## 2012-11-17 ENCOUNTER — Encounter: Payer: BC Managed Care – PPO | Admitting: Family Medicine

## 2012-12-09 ENCOUNTER — Encounter: Payer: BC Managed Care – PPO | Admitting: Family Medicine

## 2013-02-08 ENCOUNTER — Other Ambulatory Visit: Payer: BC Managed Care – PPO

## 2013-02-14 ENCOUNTER — Other Ambulatory Visit: Payer: Self-pay | Admitting: *Deleted

## 2013-02-14 DIAGNOSIS — E039 Hypothyroidism, unspecified: Secondary | ICD-10-CM

## 2013-02-14 DIAGNOSIS — Z0000 Encounter for general adult medical examination without abnormal findings: Secondary | ICD-10-CM

## 2013-02-14 DIAGNOSIS — E785 Hyperlipidemia, unspecified: Secondary | ICD-10-CM

## 2013-02-14 DIAGNOSIS — E559 Vitamin D deficiency, unspecified: Secondary | ICD-10-CM

## 2013-02-15 ENCOUNTER — Other Ambulatory Visit (INDEPENDENT_AMBULATORY_CARE_PROVIDER_SITE_OTHER): Payer: BC Managed Care – PPO

## 2013-02-15 LAB — COMPLETE METABOLIC PANEL WITH GFR
ALT: 13 U/L (ref 0–35)
AST: 14 U/L (ref 0–37)
Albumin: 4 g/dL (ref 3.5–5.2)
Alkaline Phosphatase: 69 U/L (ref 39–117)
BUN: 12 mg/dL (ref 6–23)
CO2: 26 mEq/L (ref 19–32)
Calcium: 9.2 mg/dL (ref 8.4–10.5)
Chloride: 105 mEq/L (ref 96–112)
Creat: 0.99 mg/dL (ref 0.50–1.10)
GFR, Est African American: 74 mL/min
GFR, Est Non African American: 64 mL/min
Glucose, Bld: 96 mg/dL (ref 70–99)
Potassium: 4.7 mEq/L (ref 3.5–5.3)
Sodium: 139 mEq/L (ref 135–145)
Total Bilirubin: 0.5 mg/dL (ref 0.2–1.2)
Total Protein: 7 g/dL (ref 6.0–8.3)

## 2013-02-15 LAB — CBC WITH DIFFERENTIAL/PLATELET
Basophils Absolute: 0.1 10*3/uL (ref 0.0–0.1)
Basophils Relative: 1 % (ref 0–1)
Eosinophils Absolute: 0.1 10*3/uL (ref 0.0–0.7)
Eosinophils Relative: 2 % (ref 0–5)
HCT: 42.1 % (ref 36.0–46.0)
Hemoglobin: 14.9 g/dL (ref 12.0–15.0)
Lymphocytes Relative: 50 % — ABNORMAL HIGH (ref 12–46)
Lymphs Abs: 3 10*3/uL (ref 0.7–4.0)
MCH: 31.3 pg (ref 26.0–34.0)
MCHC: 35.4 g/dL (ref 30.0–36.0)
MCV: 88.4 fL (ref 78.0–100.0)
Monocytes Absolute: 0.4 10*3/uL (ref 0.1–1.0)
Monocytes Relative: 7 % (ref 3–12)
Neutro Abs: 2.5 10*3/uL (ref 1.7–7.7)
Neutrophils Relative %: 40 % — ABNORMAL LOW (ref 43–77)
Platelets: 346 10*3/uL (ref 150–400)
RBC: 4.76 MIL/uL (ref 3.87–5.11)
RDW: 12.6 % (ref 11.5–15.5)
WBC: 6.1 10*3/uL (ref 4.0–10.5)

## 2013-02-15 LAB — LIPID PANEL
Cholesterol: 187 mg/dL (ref 0–200)
HDL: 41 mg/dL (ref >39)
LDL Cholesterol: 128 mg/dL — ABNORMAL HIGH (ref 0–99)
Total CHOL/HDL Ratio: 4.6 Ratio
Triglycerides: 88 mg/dL (ref <150)
VLDL: 18 mg/dL (ref 0–40)

## 2013-02-15 LAB — T4, FREE: Free T4: 1.46 ng/dL (ref 0.80–1.80)

## 2013-02-15 LAB — TSH: TSH: 1.098 u[IU]/mL (ref 0.350–4.500)

## 2013-02-16 LAB — VITAMIN D 25 HYDROXY (VIT D DEFICIENCY, FRACTURES): Vit D, 25-Hydroxy: 28 ng/mL — ABNORMAL LOW (ref 30–89)

## 2013-02-27 ENCOUNTER — Encounter: Payer: Self-pay | Admitting: Family Medicine

## 2013-02-27 ENCOUNTER — Encounter (INDEPENDENT_AMBULATORY_CARE_PROVIDER_SITE_OTHER): Payer: Self-pay

## 2013-02-27 ENCOUNTER — Ambulatory Visit (INDEPENDENT_AMBULATORY_CARE_PROVIDER_SITE_OTHER): Payer: BC Managed Care – PPO | Admitting: Family Medicine

## 2013-02-27 VITALS — BP 127/75 | HR 74 | Resp 16 | Ht 62.0 in | Wt 168.0 lb

## 2013-02-27 DIAGNOSIS — F411 Generalized anxiety disorder: Secondary | ICD-10-CM

## 2013-02-27 DIAGNOSIS — F3289 Other specified depressive episodes: Secondary | ICD-10-CM

## 2013-02-27 DIAGNOSIS — F329 Major depressive disorder, single episode, unspecified: Secondary | ICD-10-CM

## 2013-02-27 DIAGNOSIS — E559 Vitamin D deficiency, unspecified: Secondary | ICD-10-CM

## 2013-02-27 MED ORDER — FLUOXETINE HCL 20 MG PO CAPS: 20.0000 mg | ORAL_CAPSULE | Freq: Every day | ORAL | Status: DC

## 2013-02-27 MED ORDER — VITAMIN D (ERGOCALCIFEROL) 1.25 MG (50000 UNIT) PO CAPS: ORAL_CAPSULE | ORAL | Status: AC

## 2013-02-27 MED ORDER — HYDROXYZINE PAMOATE 25 MG PO CAPS: ORAL_CAPSULE | ORAL | Status: AC

## 2013-02-27 MED ORDER — BUPROPION HCL ER (XL) 300 MG PO TB24: 300.0000 mg | ORAL_TABLET | Freq: Every day | ORAL | Status: DC

## 2013-02-27 NOTE — Progress Notes (Signed)
Subjective:    Patient ID: Elmer Balesindy W Pennock, female    DOB: 1955-12-20, 58 y.o.   MRN: [id number]  HPI  [name] is here today to go over her most recent lab results and to discuss the conditions listed below:   1)  Hypothyroidism - She is doing well on her current dosage of Levothyroxine.  She gets it filled at work.    2)  Mood - She ran out of her Wellbutrin and has not taken it for several days.  She also takes Vistaril occasionally and needs a refill on it.    3)  IFG - She was not sure she had to take Metformin for a longer period.  She only took it for 30 days.    Review of Systems  Constitutional: Negative for appetite change and unexpected weight change.  Endocrine: Negative for heat intolerance.  Musculoskeletal: Negative for arthralgias and neck pain.  Psychiatric/Behavioral: The patient is not nervous/anxious.      Past Medical History  Diagnosis Date  . Thyroid disease   . Depression   . Fatigue   . Chronic fatigue   . Insomnia   . Colon polyps   . Cervical dysplasia   . Osteoporosis      Past Surgical History  Procedure Laterality Date  . Leep    . Dental surgery      Bone Graft (2x)      History   Social History Narrative   Marital Status: Married Government social research officer(Mark)   Children:  G 3 P2012   Pets: Cats (3)   Living Situation: Lives with her spouse   Occupation: Systems developerAnalyst 1 [employer]    Education: Engineer, agriculturalHigh School Graduate; She is currently enrolled in a [facility] class at [employer].     Tobacco Use/Exposure:  None    Alcohol Use:  Occasional   Drug Use:  None   Diet:  Regular   Exercise:  Limited    Hobbies:   Gardening, Knitting, Crocheting                  Family History  Problem Relation Age of Onset  . Alzheimer's disease Mother   . Alzheimer's disease Paternal Grandfather   . COPD Father      Current Outpatient Prescriptions on File Prior to Visit  Medication Sig Dispense Refill  . diclofenac sodium (VOLTAREN) 1 % GEL Apply 4 g topically 4 (four) times  daily.      Marland Kitchen. levothyroxine (SYNTHROID, LEVOTHROID) 150 MCG tablet Take 1 tablet (150 mcg total) by mouth daily before breakfast.  90 tablet  3  . ranitidine (ZANTAC) 150 MG tablet Take 1 tablet (150 mg total) by mouth 2 (two) times daily.  180 tablet  3  . buPROPion (WELLBUTRIN SR) 150 MG 12 hr tablet Take 1 tablet (150 mg total) by mouth 2 (two) times daily.  60 tablet  1  . cyclobenzaprine (FLEXERIL) 10 MG tablet Take 1 tab po q hs for muscle spasm  90 tablet  3  . lidocaine (LIDODERM) 5 % Place 1 patch onto the skin daily. Remove & Discard patch within 12 hours or as directed by MD      . metFORMIN (GLUCOPHAGE XR) 500 MG 24 hr tablet Take up to 2 tabs at night  60 tablet  5   No current facility-administered medications on file prior to visit.     Allergies  Allergen Reactions  . Erythromycin     Lightheadeness  Immunization History  Administered Date(s) Administered  . Tdap 01/12/2012      Objective:   Physical Exam  Vitals reviewed. Constitutional: She is oriented to person, place, and time.  Eyes: Conjunctivae are normal. No scleral icterus.  Neck: Neck supple. No thyromegaly present.  Cardiovascular: Normal rate, regular rhythm and normal heart sounds.   Pulmonary/Chest: Effort normal and breath sounds normal.  Musculoskeletal: She exhibits no edema and no tenderness.  Lymphadenopathy:    She has no cervical adenopathy.  Neurological: She is alert and oriented to person, place, and time.  Skin: Skin is warm and dry.  Psychiatric: She has a normal mood and affect. Her behavior is normal. Judgment and thought content normal.      Assessment & Plan:    Nely was seen today for medication management.  Diagnoses and associated orders for this visit:  Depressive disorder, not elsewhere classified - buPROPion (WELLBUTRIN XL) 300 MG 24 hr tablet; Take 1 tablet (300 mg total) by mouth daily. - FLUoxetine (PROZAC) 20 MG capsule; Take 1 capsule (20 mg total) by mouth  daily.  Anxiety state, unspecified - hydrOXYzine (VISTARIL) 25 MG capsule; Take 1 capsule po as needed for increased anxiety  Unspecified vitamin D deficiency - Vitamin D, Ergocalciferol, (DRISDOL) 50000 UNITS CAPS capsule; Take 1 capsule po twice a week   TIME SPENT "FACE TO FACE" WITH PATIENT -  30 MINS

## 2013-03-08 ENCOUNTER — Other Ambulatory Visit: Payer: Self-pay | Admitting: Family Medicine

## 2013-03-24 ENCOUNTER — Ambulatory Visit (INDEPENDENT_AMBULATORY_CARE_PROVIDER_SITE_OTHER): Payer: BC Managed Care – PPO | Admitting: Family Medicine

## 2013-03-24 ENCOUNTER — Other Ambulatory Visit (HOSPITAL_COMMUNITY)
Admission: RE | Admit: 2013-03-24 | Discharge: 2013-03-24 | Disposition: A | Discharge status: Home / Self Care | Payer: BC Managed Care – PPO | Source: Ambulatory Visit | Attending: Family Medicine | Admitting: Family Medicine

## 2013-03-24 ENCOUNTER — Encounter: Payer: Self-pay | Admitting: Family Medicine

## 2013-03-24 VITALS — BP 115/81 | HR 80 | Resp 16 | Ht 62.0 in | Wt 170.0 lb

## 2013-03-24 DIAGNOSIS — Z124 Encounter for screening for malignant neoplasm of cervix: Secondary | ICD-10-CM

## 2013-03-24 DIAGNOSIS — F3289 Other specified depressive episodes: Secondary | ICD-10-CM

## 2013-03-24 DIAGNOSIS — F329 Major depressive disorder, single episode, unspecified: Secondary | ICD-10-CM

## 2013-03-24 DIAGNOSIS — Z0000 Encounter for general adult medical examination without abnormal findings: Secondary | ICD-10-CM

## 2013-03-24 DIAGNOSIS — Z01419 Encounter for gynecological examination (general) (routine) without abnormal findings: Secondary | ICD-10-CM | POA: Insufficient documentation

## 2013-03-24 DIAGNOSIS — E669 Obesity, unspecified: Secondary | ICD-10-CM

## 2013-03-24 DIAGNOSIS — Z1151 Encounter for screening for human papillomavirus (HPV): Secondary | ICD-10-CM | POA: Insufficient documentation

## 2013-03-24 LAB — POCT URINALYSIS DIPSTICK
Bilirubin, UA: NEGATIVE
Blood, UA: NEGATIVE
Glucose, UA: NEGATIVE
Ketones, UA: NEGATIVE
Leukocytes, UA: NEGATIVE
Nitrite, UA: NEGATIVE
Protein, UA: NEGATIVE
Spec Grav, UA: 1.02
Urobilinogen, UA: NEGATIVE
pH, UA: 5

## 2013-03-24 MED ORDER — PHENTERMINE-TOPIRAMATE ER 7.5-46 MG PO CP24: 1.0000 | ORAL_CAPSULE | Freq: Every day | ORAL | Status: DC

## 2013-03-24 MED ORDER — FLUOXETINE HCL 40 MG PO CAPS: 40.0000 mg | ORAL_CAPSULE | Freq: Every day | ORAL | Status: DC

## 2013-03-24 MED ORDER — PHENTERMINE-TOPIRAMATE ER 3.75-23 MG PO CP24: 1.0000 | ORAL_CAPSULE | Freq: Every day | ORAL | Status: DC

## 2013-03-24 NOTE — Progress Notes (Signed)
Subjective:    Patient ID: Jodi Jones, female    DOB: [birth year], 58 y.o.   MRN: [id number]  HPI  Jodi Jones is here today for her annual CPE/PAP smear.  She has done well since her last office visit.  Overall she feels that her health is good except her weight.  She would like to start a new diet.  She also needs to have her Prozac refilled.     Review of Systems  Constitutional: Negative for activity change, appetite change, fatigue and unexpected weight change.  HENT: Negative for congestion, dental problem, ear pain, hearing loss, trouble swallowing and voice change.   Eyes: Negative for pain, redness and visual disturbance.  Respiratory: Negative for cough and shortness of breath.   Cardiovascular: Negative for chest pain, palpitations and leg swelling.  Gastrointestinal: Negative for nausea, vomiting, abdominal pain, diarrhea, constipation and blood in stool.  Endocrine: Negative for cold intolerance, heat intolerance, polydipsia, polyphagia and polyuria.  Genitourinary: Negative for dysuria, urgency, frequency, hematuria, vaginal discharge and pelvic pain.  Musculoskeletal: Negative for arthralgias, back pain, joint swelling, myalgias and neck pain.  Skin: Negative for rash.  Neurological: Negative for dizziness, weakness and headaches.  Hematological: Negative for adenopathy. Does not bruise/bleed easily.  Psychiatric/Behavioral: Negative for sleep disturbance, dysphoric mood and decreased concentration. The patient is not nervous/anxious.      Past Medical History  Diagnosis Date  . Thyroid disease   . Depression   . Fatigue   . Chronic fatigue   . Insomnia   . Colon polyps   . Cervical dysplasia   . Osteoporosis      Past Surgical History  Procedure Laterality Date  . Leep    . 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     Tobacco Use/Exposure:  None    Alcohol Use:  Occasional   Drug Use:  None   Diet:  Regular   Exercise:  Limited    Hobbies:   Gardening, Knitting, Crocheting                  Family History  Problem Relation Age of Onset  . Alzheimer's disease Mother   . Alzheimer's disease Paternal Grandfather   . COPD Father      Current Outpatient Prescriptions on File Prior to Visit  Medication Sig Dispense Refill  . buPROPion (WELLBUTRIN SR) 150 MG 12 hr tablet Take 1 tablet (150 mg total) by mouth 2 (two) times daily.  60 tablet  1  . cyclobenzaprine (FLEXERIL) 10 MG tablet Take 1 tab po q hs for muscle spasm  90 tablet  3  . diclofenac sodium (VOLTAREN) 1 % GEL Apply 4 g topically 4 (four) times daily.      . hydrOXYzine (VISTARIL) 25 MG capsule Take 1 capsule po as needed for increased anxiety  30 capsule  2  . levothyroxine (SYNTHROID, LEVOTHROID) 150 MCG tablet Take 1 tablet (150 mcg total) by mouth daily before breakfast.  90 tablet  3  . lidocaine (LIDODERM) 5 % Place 1 patch onto the skin daily. Remove & Discard patch within 12 hours or  as directed by MD      . ranitidine (ZANTAC) 150 MG tablet Take 1 tablet (150 mg total) by mouth 2 (two) times daily.  180 tablet  3  . Vitamin D, Ergocalciferol, (DRISDOL) 50000 UNITS CAPS capsule Take 1 capsule po twice a week  24 capsule  3   No current facility-administered medications on file prior to visit.     Allergies  Allergen Reactions  . Erythromycin     Lightheadeness      Immunization History  Administered Date(s) Administered  . Tdap 01/12/2012       Objective:   Physical Exam  Nursing note and vitals reviewed. Constitutional: She is oriented to person, place, and time. She appears well-developed and well-nourished.  HENT:  Head: Normocephalic and atraumatic.  Right Ear: External  ear normal.  Left Ear: External ear normal.  Nose: Nose normal.  Mouth/Throat: Oropharynx is clear and moist.  Eyes: Conjunctivae and EOM are normal. Pupils are equal, round, and reactive to light.  Neck: Normal range of motion. No thyromegaly present.  Cardiovascular: Normal rate, regular rhythm, normal heart sounds and intact distal pulses.  Exam reveals no gallop and no friction rub.   No murmur heard. Pulmonary/Chest: Effort normal and breath sounds normal. Right breast exhibits no inverted nipple, no mass, no nipple discharge, no skin change and no tenderness. Left breast exhibits no inverted nipple, no mass, no nipple discharge, no skin change and no tenderness. Breasts are symmetrical.  Abdominal: Soft. Bowel sounds are normal. Hernia confirmed negative in the right inguinal area and confirmed negative in the left inguinal area.  Genitourinary: Vagina normal and uterus normal. Pelvic exam was performed with patient supine. There is no rash, tenderness or lesion on the right labia. There is no rash, tenderness or lesion on the left labia. No vaginal discharge found.  Musculoskeletal: Normal range of motion. She exhibits no edema and no tenderness.  Lymphadenopathy:    She has no cervical adenopathy.       Right: No inguinal adenopathy present.       Left: No inguinal adenopathy present.  Neurological: She is alert and oriented to person, place, and time. She has normal reflexes.  Skin: Skin is warm and dry.  Psychiatric: She has a normal mood and affect. Her behavior is normal. Judgment and thought content normal.      Assessment & Plan:  Jodi Jones was seen today for annual exam.  Diagnoses and associated orders for this visit:  Routine general medical examination at a health care facility The patient had a normal CPE.  We addressed preventative issues appropriate for her age.  Her U/A and EKG were WNL.   - POCT urinalysis dipstick - EKG 12-Lead  Screening for malignant neoplasm of  the cervix Pap smear was done without any difficulty and [facility]. She will receive her results in the mail. She was told that if her result is abnormal, we will bring her in to discuss the abnormality in great detail.  - Cytology - PAP  Obesity, unspecified - Phentermine-Topiramate (QSYMIA) 3.75-23 MG CP24; Take 1 tablet by mouth daily. - Phentermine-Topiramate (QSYMIA) 7.5-46 MG CP24; Take 1 tablet by mouth daily.  Depressive disorder, not elsewhere classified - FLUoxetine (PROZAC) 40 MG capsule; Take 1 capsule (40 mg total) by mouth daily.

## 2013-03-24 NOTE — Patient Instructions (Addendum)
1)  Weight - Try the Qsymia for weight loss.    2)  Mood - Take Wellbutrin 150 mg twice a day and Prozac 40 mg.   Exercise to Stay Healthy Exercise helps you become and stay healthy. EXERCISE IDEAS AND TIPS Choose exercises that:  You enjoy.  Fit into your day. You do not need to exercise really hard to be healthy. You can do exercises at a slow or medium level and stay healthy. You can:  Stretch before and after working out.  Try yoga, Pilates, or tai chi.  Lift weights.  Walk fast, swim, jog, run, climb stairs, bicycle, dance, or rollerskate.  Take aerobic classes. Exercises that burn about 150 calories:  Running 1  miles in 15 minutes.  Playing volleyball for 45 to 60 minutes.  Washing and waxing a car for 45 to 60 minutes.  Playing touch football for 45 minutes.  Walking 1  miles in 35 minutes.  Pushing a stroller 1  miles in 30 minutes.  Playing basketball for 30 minutes.  Raking leaves for 30 minutes.  Bicycling 5 miles in 30 minutes.  Walking 2 miles in 30 minutes.  Dancing for 30 minutes.  Shoveling snow for 15 minutes.  Swimming laps for 20 minutes.  Walking up stairs for 15 minutes.  Bicycling 4 miles in 15 minutes.  Gardening for 30 to 45 minutes.  Jumping rope for 15 minutes.  Washing windows or floors for 45 to 60 minutes. Document Released: 02/07/2010 Document Revised: 03/30/2011 Document Reviewed: 02/07/2010 [facility] Patient Information 2014 [place], [place].

## 2013-03-27 ENCOUNTER — Encounter: Payer: Self-pay | Admitting: *Deleted

## 2013-03-29 ENCOUNTER — Ambulatory Visit (INDEPENDENT_AMBULATORY_CARE_PROVIDER_SITE_OTHER): Payer: BC Managed Care – PPO | Admitting: Family Medicine

## 2013-03-29 ENCOUNTER — Encounter: Payer: Self-pay | Admitting: Family Medicine

## 2013-03-29 VITALS — BP 125/85 | HR 77 | Wt 170.0 lb

## 2013-03-29 DIAGNOSIS — R42 Dizziness and giddiness: Secondary | ICD-10-CM

## 2013-03-29 MED ORDER — MECLIZINE HCL 25 MG PO TABS: 25.0000 mg | ORAL_TABLET | Freq: Three times a day (TID) | ORAL | Status: AC | PRN

## 2013-03-29 NOTE — Patient Instructions (Signed)
1)  Nose Bleed - Rinse out your nasal passage with a nasal saline rinse like simply saline.  You can use a normal saline gel along and/or Vaseline to keep your nasal passage moist.    2)  Dizzines - Take the Antivert up to 3 times per day as needed for dizziness.  If the dizziness continues, contact Dr. Bryson Ha.    3)  Nausea/Vomiting - Drink some G2 to keep yourself hydrated.      Dizziness Dizziness is a common problem. It is a feeling of unsteadiness or lightheadedness. You may feel like you are about to faint. Dizziness can lead to injury if you stumble or fall. A person of any age group can suffer from dizziness, but dizziness is more common in older adults. CAUSES  Dizziness can be caused by many different things, including:  Middle ear problems.  Standing for too long.  Infections.  An allergic reaction.  Aging.  An emotional response to something, such as the sight of blood.  Side effects of medicines.  Fatigue.  Problems with circulation or blood pressure.  Excess use of alcohol, medicines, or illegal drug use.  Breathing too fast (hyperventilation).  An arrhythmia or problems with your heart rhythm.  Low red blood cell count (anemia).  Pregnancy.  Vomiting, diarrhea, fever, or other illnesses that cause dehydration.  Diseases or conditions such as Parkinson's disease, high blood pressure (hypertension), diabetes, and thyroid problems.  Exposure to extreme heat. DIAGNOSIS  To find the cause of your dizziness, your caregiver may do a physical exam, lab tests, radiologic imaging scans, or an electrocardiography test (ECG).  TREATMENT  Treatment of dizziness depends on the cause of your symptoms and can vary greatly. HOME CARE INSTRUCTIONS   Drink enough fluids to keep your urine clear or pale yellow. This is especially important in very hot weather. In the elderly, it is also important in cold weather.  If your dizziness is caused by medicines, take them  exactly as directed. When taking blood pressure medicines, it is especially important to get up slowly.  Rise slowly from chairs and steady yourself until you feel okay.  In the morning, first sit up on the side of the bed. When this seems okay, stand slowly while holding onto something until you know your balance is fine.  If you need to stand in one place for a long time, be sure to move your legs often. Tighten and relax the muscles in your legs while standing.  If dizziness continues to be a problem, have someone stay with you for a day or two. Do this until you feel you are well enough to stay alone. Have the person call your caregiver if he or she notices changes in you that are concerning.  Do not drive or use heavy machinery if you feel dizzy.  Do not drink alcohol. SEEK IMMEDIATE MEDICAL CARE IF:   Your dizziness or lightheadedness gets worse.  You feel nauseous or vomit.  You develop problems with talking, walking, weakness, or using your arms, hands, or legs.  You are not thinking clearly or you have difficulty forming sentences. It may take a friend or family member to determine if your thinking is normal.  You develop chest pain, abdominal pain, shortness of breath, or sweating.  Your vision changes.  You notice any bleeding.  You have side effects from medicine that seems to be getting worse rather than better. MAKE SURE YOU:   Understand these instructions.  Will watch  your condition.  Will get help right away if you are not doing well or get worse. Document Released: 07/01/2000 Document Revised: 03/30/2011 Document Reviewed: 07/25/2010 [facility] Patient Information 2014 [employer] [place].

## 2013-03-29 NOTE — Progress Notes (Signed)
Subjective:    Patient ID: Jodi Jones, female    DOB: 1956-01-01, 58 y.o.   MRN: [id number]  [name] is here today to discuss the dizziness she started having this morning.  She says that she was getting ready to work when she became dizzy and nauseated.  She vomited and noted some nasal bleeding which worried her so she wanted to come in to be checked.     Dizziness This is a chronic problem. The current episode started today. The problem occurs constantly. The problem has been waxing and waning. Associated symptoms include coughing, nausea, vertigo and vomiting. Associated symptoms comments: Nasal bleeding . The symptoms are aggravated by standing and walking. She has tried lying down (Dayquil and Nyquil (generic brands)) for the symptoms.     Review of Systems  HENT: Positive for postnasal drip.   Respiratory: Positive for cough.   Gastrointestinal: Positive for nausea and vomiting.  Neurological: Positive for dizziness and vertigo.    Past Medical History  Diagnosis Date  . Thyroid disease   . Depression   . Fatigue   . Chronic fatigue   . Insomnia   . Colon polyps   . Cervical dysplasia   . Osteoporosis      Past Surgical History  Procedure Laterality Date  . Leep    . 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     Tobacco Use/Exposure:  None    Alcohol Use:  Occasional   Drug Use:  None   Diet:  Regular   Exercise:  Limited    Hobbies:   Gardening, Knitting, Crocheting                  Family History  Problem Relation Age of Onset  . Alzheimer's disease Mother   . Alzheimer's disease Paternal Grandfather   . COPD Father      Current Outpatient Prescriptions on File Prior to  Visit  Medication Sig Dispense Refill  . buPROPion (WELLBUTRIN SR) 150 MG 12 hr tablet Take 1 tablet (150 mg total) by mouth 2 (two) times daily.  60 tablet  1  . cyclobenzaprine (FLEXERIL) 10 MG tablet Take 1 tab po q hs for muscle spasm  90 tablet  3  . diclofenac sodium (VOLTAREN) 1 % GEL Apply 4 g topically 4 (four) times daily.      Marland Kitchen FLUoxetine (PROZAC) 40 MG capsule Take 1 capsule (40 mg total) by mouth daily.  90 capsule  1  . hydrOXYzine (VISTARIL) 25 MG capsule Take 1 capsule po as needed for increased anxiety  30 capsule  2  . levothyroxine (SYNTHROID, LEVOTHROID) 150 MCG tablet Take 1 tablet (150 mcg total) by mouth daily before breakfast.  90 tablet  3  . lidocaine (LIDODERM) 5 % Place 1 patch onto the skin daily. Remove & Discard patch within 12 hours or as directed by MD      . ranitidine (ZANTAC) 150 MG tablet Take 1 tablet (150 mg total) by mouth 2 (two) times daily.  180 tablet  3  . Vitamin D, Ergocalciferol, (DRISDOL) 50000 UNITS CAPS capsule Take 1  capsule po twice a week  24 capsule  3  . Phentermine-Topiramate (QSYMIA) 3.75-23 MG CP24 Take 1 tablet by mouth daily.  14 capsule  0  . Phentermine-Topiramate (QSYMIA) 7.5-46 MG CP24 Take 1 tablet by mouth daily.  30 capsule  2   No current facility-administered medications on file prior to visit.     Allergies  Allergen Reactions  . Erythromycin     Lightheadeness      Immunization History  Administered Date(s) Administered  . Tdap 01/12/2012       Objective:   Physical Exam  Nursing note and vitals reviewed. Constitutional: She is oriented to person, place, and time.  HENT:  Right Ear: Tympanic membrane and ear canal normal.  Left Ear: Tympanic membrane and ear canal normal.  Nose: Nose normal.  Neck: Neck supple. Carotid bruit is not present. No mass and no thyromegaly present.  Cardiovascular: Normal rate and regular rhythm.   Pulmonary/Chest: Effort normal and breath sounds normal.  Neurological: She is  alert and oriented to person, place, and time.       Assessment & Plan:    [name] was seen today for dizziness.  Diagnoses and associated orders for this visit:  Dizziness and giddiness Comments: She is to stay hydrated with G2.  She was given Antivert and was encouraged to get in to see Dr. Bryson HaKwan.   - meclizine (ANTIVERT) 25 MG tablet; Take 1 tablet (25 mg total) by mouth 3 (three) times daily as needed for dizziness or nausea.

## 2013-06-28 ENCOUNTER — Other Ambulatory Visit: Payer: Self-pay | Admitting: Family Medicine

## 2013-07-05 ENCOUNTER — Other Ambulatory Visit: Payer: Self-pay | Admitting: Family Medicine

## 2013-07-05 DIAGNOSIS — F329 Major depressive disorder, single episode, unspecified: Secondary | ICD-10-CM

## 2013-07-05 DIAGNOSIS — F3289 Other specified depressive episodes: Secondary | ICD-10-CM

## 2013-07-05 NOTE — Telephone Encounter (Signed)
[name] called and said she needed a refill on her Prozac 40 mg. She gave the prescription to them at work because they had said they were going to start carrying it, but now they have decided not to carry it and they do not know what they did with prescription, So [name] would like for us to call in to [employer]. I told her that she may have to come in first, we can call her back at [phone]

## 2013-07-27 NOTE — Telephone Encounter (Signed)
Jodi AspCindy called needing refills on her Welbutrin and Prozac, I think she needs an appointment.

## 2013-08-02 ENCOUNTER — Other Ambulatory Visit: Payer: Self-pay | Admitting: Family Medicine

## 2013-08-08 ENCOUNTER — Other Ambulatory Visit: Payer: Self-pay | Admitting: Family Medicine

## 2013-08-18 ENCOUNTER — Encounter: Payer: Self-pay | Admitting: Family Medicine

## 2013-08-18 ENCOUNTER — Ambulatory Visit (INDEPENDENT_AMBULATORY_CARE_PROVIDER_SITE_OTHER): Payer: BC Managed Care – PPO | Admitting: Family Medicine

## 2013-08-18 VITALS — BP 126/80 | HR 80 | Resp 16 | Ht 62.0 in | Wt 170.0 lb

## 2013-08-18 DIAGNOSIS — Z5181 Encounter for therapeutic drug level monitoring: Secondary | ICD-10-CM

## 2013-08-18 DIAGNOSIS — F341 Dysthymic disorder: Secondary | ICD-10-CM

## 2013-08-18 DIAGNOSIS — K219 Gastro-esophageal reflux disease without esophagitis: Secondary | ICD-10-CM

## 2013-08-18 DIAGNOSIS — F329 Major depressive disorder, single episode, unspecified: Secondary | ICD-10-CM

## 2013-08-18 DIAGNOSIS — E669 Obesity, unspecified: Secondary | ICD-10-CM

## 2013-08-18 DIAGNOSIS — F419 Anxiety disorder, unspecified: Secondary | ICD-10-CM

## 2013-08-18 DIAGNOSIS — F32A Depression, unspecified: Secondary | ICD-10-CM

## 2013-08-18 MED ORDER — FLUOXETINE HCL 40 MG PO CAPS: 40.0000 mg | ORAL_CAPSULE | Freq: Every day | ORAL | Status: DC

## 2013-08-18 MED ORDER — BUPROPION HCL ER (XL) 300 MG PO TB24: 300.0000 mg | ORAL_TABLET | Freq: Every day | ORAL | Status: AC

## 2013-08-18 MED ORDER — RANITIDINE HCL 150 MG PO TABS: 150.0000 mg | ORAL_TABLET | Freq: Two times a day (BID) | ORAL | Status: DC

## 2013-08-18 MED ORDER — RANITIDINE HCL 150 MG PO TABS: 150.0000 mg | ORAL_TABLET | Freq: Two times a day (BID) | ORAL | Status: AC

## 2013-08-18 MED ORDER — PHENTERMINE-TOPIRAMATE ER 7.5-46 MG PO CP24: 1.0000 | ORAL_CAPSULE | Freq: Every day | ORAL | Status: AC

## 2013-08-18 MED ORDER — FLUOXETINE HCL 40 MG PO CAPS: 40.0000 mg | ORAL_CAPSULE | Freq: Every day | ORAL | Status: AC

## 2013-08-18 NOTE — Progress Notes (Signed)
Subjective:    Patient ID: Elmer Balesindy W Villagran, female    DOB: 1955/03/01, 58 y.o.   MRN: [id number]  HPI  [name] is here today to get medication refills.  1)  Anxiety:  She feels that her mood is good on the combination of Wellbutrin and Prozac. She has been out of them for about a month and feels that she needs to get back on them.  2)  Weight:  She would like to get a refill on the Qsymia.   3)  GERD:  She is doing well on  Zantac. She would like a prescription for this.     Review of Systems  Constitutional: Negative for activity change, appetite change, fatigue and unexpected weight change.  HENT: Negative.   Eyes: Negative.   Respiratory: Negative for shortness of breath.   Cardiovascular: Negative for chest pain, palpitations and leg swelling.  Gastrointestinal: Negative for diarrhea and constipation.  Endocrine: Negative.   Genitourinary: Negative for difficulty urinating.  Musculoskeletal: Negative.   Skin: Negative.   Neurological: Negative.   Hematological: Negative for adenopathy. Does not bruise/bleed easily.  Psychiatric/Behavioral: Negative for sleep disturbance and dysphoric mood. The patient is not nervous/anxious.   All other systems reviewed and are negative.    Past Medical History  Diagnosis Date  . Thyroid disease   . Depression   . Fatigue   . Chronic fatigue   . Insomnia   . Colon polyps   . Cervical dysplasia   . Osteoporosis      Past Surgical History  Procedure Laterality Date  . Leep    . 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     Tobacco Use/Exposure:  None    Alcohol Use:  Occasional   Drug Use:  None   Diet:  Regular   Exercise:  Limited    Hobbies:    Gardening, Knitting, Crocheting                  Family History  Problem Relation Age of Onset  . Alzheimer's disease Mother   . Alzheimer's disease Paternal Grandfather   . COPD Father      Current Outpatient Prescriptions on File Prior to Visit  Medication Sig Dispense Refill  . buPROPion (WELLBUTRIN SR) 150 MG 12 hr tablet Take 1 tablet (150 mg total) by mouth 2 (two) times daily.  60 tablet  1  . diclofenac sodium (VOLTAREN) 1 % GEL Apply 4 g topically 4 (four) times daily.      . hydrOXYzine (VISTARIL) 25 MG capsule Take 1 capsule po as needed for increased anxiety  30 capsule  2  . meclizine (ANTIVERT) 25 MG tablet Take 1 tablet (25 mg total) by mouth 3 (three) times daily as needed for dizziness or nausea.  30 tablet  1  . Vitamin D, Ergocalciferol, (DRISDOL) 50000 UNITS CAPS capsule Take 1 capsule po twice a week  24 capsule  3  . cyclobenzaprine (FLEXERIL) 10 MG tablet TAKE ONE TABLET BY MOUTH AT BEDTIME FOR MUSCLE SPASM  90 tablet  2   No current facility-administered medications on file prior to  visit.     Allergies  Allergen Reactions  . Erythromycin     Lightheadeness      Immunization History  Administered Date(s) Administered  . Tdap 01/12/2012       Objective:   Physical Exam  Nursing note and vitals reviewed. Constitutional: She is oriented to person, place, and time.  HENT:  Right Ear: Tympanic membrane and ear canal normal.  Left Ear: Tympanic membrane and ear canal normal.  Nose: Nose normal.  Neck: Neck supple. Carotid bruit is not present. No mass and no thyromegaly present.  Cardiovascular: Normal rate and regular rhythm.   Pulmonary/Chest: Effort normal and breath sounds normal.  Neurological: She is alert and oriented to person, place, and time.      Assessment & Plan:    Kendalyn was seen today for medication management and medical management of chronic issues.  Diagnoses and associated orders for this visit:  Gastroesophageal  reflux disease without esophagitis - Discontinue: ranitidine (ZANTAC) 150 MG tablet; Take 1 tablet (150 mg total) by mouth 2 (two) times daily. - ranitidine (ZANTAC) 150 MG tablet; Take 1 tablet (150 mg total) by mouth 2 (two) times daily.  Obesity, unspecified - Phentermine-Topiramate (QSYMIA) 7.5-46 MG CP24; Take 1 tablet by mouth daily.  Anxiety and depression - buPROPion (WELLBUTRIN XL) 300 MG 24 hr tablet; Take 1 tablet (300 mg total) by mouth daily. - FLUoxetine (PROZAC) 40 MG capsule; Take 1 capsule (40 mg total) by mouth daily.  Encounter for therapeutic drug monitoring - COMPLETE METABOLIC PANEL WITH GFR   TIME SPENT "FACE TO FACE" WITH PATIENT -  30 MINS

## 2013-08-19 LAB — COMPLETE METABOLIC PANEL WITH GFR
ALT: 13 U/L (ref 0–35)
AST: 13 U/L (ref 0–37)
Albumin: 3.7 g/dL (ref 3.5–5.2)
Alkaline Phosphatase: 73 U/L (ref 39–117)
BUN: 15 mg/dL (ref 6–23)
CO2: 23 mEq/L (ref 19–32)
Calcium: 9.6 mg/dL (ref 8.4–10.5)
Chloride: 94 mEq/L — ABNORMAL LOW (ref 96–112)
Creat: 1.04 mg/dL (ref 0.50–1.10)
GFR, Est African American: 69 mL/min
GFR, Est Non African American: 60 mL/min
Glucose, Bld: 91 mg/dL (ref 70–99)
Potassium: 4 mEq/L (ref 3.5–5.3)
Sodium: 139 mEq/L (ref 135–145)
Total Bilirubin: 0.3 mg/dL (ref 0.2–1.2)
Total Protein: 7 g/dL (ref 6.0–8.3)

## 2013-08-21 NOTE — Telephone Encounter (Signed)
[name] called and said she forgot to get a refill her Flexeril. Can we call that in for her.

## 2013-09-17 DIAGNOSIS — Z5181 Encounter for therapeutic drug level monitoring: Secondary | ICD-10-CM | POA: Insufficient documentation

## 2013-09-17 DIAGNOSIS — E669 Obesity, unspecified: Secondary | ICD-10-CM | POA: Insufficient documentation

## 2015-02-08 ENCOUNTER — Encounter (HOSPITAL_BASED_OUTPATIENT_CLINIC_OR_DEPARTMENT_OTHER): Payer: Self-pay | Admitting: Emergency Medicine

## 2015-02-08 ENCOUNTER — Emergency Department (HOSPITAL_BASED_OUTPATIENT_CLINIC_OR_DEPARTMENT_OTHER)
Admission: EM | Admit: 2015-02-08 | Discharge: 2015-02-08 | Disposition: A | Discharge status: Home / Self Care | Payer: BLUE CROSS/BLUE SHIELD | Attending: Emergency Medicine | Admitting: Emergency Medicine

## 2015-02-08 DIAGNOSIS — F329 Major depressive disorder, single episode, unspecified: Secondary | ICD-10-CM | POA: Insufficient documentation

## 2015-02-08 DIAGNOSIS — Z87410 Personal history of cervical dysplasia: Secondary | ICD-10-CM | POA: Insufficient documentation

## 2015-02-08 DIAGNOSIS — Z87891 Personal history of nicotine dependence: Secondary | ICD-10-CM | POA: Insufficient documentation

## 2015-02-08 DIAGNOSIS — Z8669 Personal history of other diseases of the nervous system and sense organs: Secondary | ICD-10-CM | POA: Insufficient documentation

## 2015-02-08 DIAGNOSIS — Z880 Allergy status to penicillin: Secondary | ICD-10-CM | POA: Diagnosis not present

## 2015-02-08 DIAGNOSIS — E079 Disorder of thyroid, unspecified: Secondary | ICD-10-CM | POA: Insufficient documentation

## 2015-02-08 DIAGNOSIS — Z3202 Encounter for pregnancy test, result negative: Secondary | ICD-10-CM | POA: Insufficient documentation

## 2015-02-08 DIAGNOSIS — Z791 Long term (current) use of non-steroidal anti-inflammatories (NSAID): Secondary | ICD-10-CM | POA: Diagnosis not present

## 2015-02-08 DIAGNOSIS — N390 Urinary tract infection, site not specified: Secondary | ICD-10-CM | POA: Diagnosis not present

## 2015-02-08 DIAGNOSIS — Z79899 Other long term (current) drug therapy: Secondary | ICD-10-CM | POA: Diagnosis not present

## 2015-02-08 DIAGNOSIS — Z86010 Personal history of colonic polyps: Secondary | ICD-10-CM | POA: Diagnosis not present

## 2015-02-08 DIAGNOSIS — R102 Pelvic and perineal pain: Secondary | ICD-10-CM | POA: Diagnosis present

## 2015-02-08 LAB — URINALYSIS, ROUTINE W REFLEX MICROSCOPIC
Glucose, UA: NEGATIVE mg/dL
Ketones, ur: 15 mg/dL — AB
NITRITE: POSITIVE — AB
PH: 5 (ref 5.0–8.0)
Protein, ur: 100 mg/dL — AB
SPECIFIC GRAVITY, URINE: 1.028 (ref 1.005–1.030)

## 2015-02-08 LAB — PREGNANCY, URINE: Preg Test, Ur: NEGATIVE

## 2015-02-08 LAB — URINE MICROSCOPIC-ADD ON

## 2015-02-08 MED ORDER — SULFAMETHOXAZOLE-TRIMETHOPRIM 800-160 MG PO TABS: 1.0000 | ORAL_TABLET | Freq: Two times a day (BID) | ORAL | Status: AC

## 2015-02-08 MED ORDER — ONDANSETRON 4 MG PO TBDP
4.0000 mg | ORAL_TABLET | Freq: Once | ORAL | Status: AC
  Administered 2015-02-08: 4 mg via ORAL
  Filled 2015-02-08: qty 1

## 2015-02-08 MED ORDER — ONDANSETRON HCL 4 MG PO TABS: 4.0000 mg | ORAL_TABLET | Freq: Four times a day (QID) | ORAL | Status: AC

## 2015-02-08 MED ORDER — SULFAMETHOXAZOLE-TRIMETHOPRIM 800-160 MG PO TABS
1.0000 | ORAL_TABLET | Freq: Once | ORAL | Status: AC
  Administered 2015-02-08: 1 via ORAL
  Filled 2015-02-08: qty 1

## 2015-02-08 NOTE — ED Notes (Signed)
PA at bedside.

## 2015-02-08 NOTE — Discharge Instructions (Signed)

## 2015-02-08 NOTE — ED Provider Notes (Signed)
CSN: [id number]     Arrival date & time 02/08/15  2002 History   First MD Initiated Contact with Patient 02/08/15 2025     Chief Complaint  Patient presents with  . Pelvic Pain     (Consider location/radiation/quality/duration/timing/severity/associated sxs/prior Treatment) HPI    60 year old female with history of thyroid disease, chronic fatigue, colon polyps, and cervical dysplasia presenting with complaints of urinary discomfort. Patient report for the past 2 days she has had burning on urination, low pelvic pain, having urge to urinate with increased urinary frequency. Complaining of pain  Wrapping around to the back that has been persistent. Patient mentioned that since her pregnancy she has had complication with her bladder, having to push her abdomen 248 and having urge incontinence. She wears a pad to help with her incontinence. Patient was concerned for possible bladder prolapse which she was told by her OB/GYN in the past that she may need surgical  Intervention but did not go through due to fear of complication. She has not been sexually active for several years due to having pain with sexual activities in the past. She recall having urinary tract infection several years ago with similar symptoms. She denies vaginal discharge but has noticed blood in her urine for the past few days. Patient tried taking Azo with minimal improvement. She is been feeling nauseous and has vomited multiple times of nonbloody nonbilious content.    Past Medical History  Diagnosis Date  . Thyroid disease   . Depression   . Fatigue   . Chronic fatigue   . Insomnia   . Colon polyps   . Cervical dysplasia   . Osteoporosis    Past Surgical History  Procedure Laterality Date  . Leep    . Dental surgery      Bone Graft (2x)    Family History  Problem Relation Age of Onset  . Alzheimer's disease Mother   . Alzheimer's disease Paternal Grandfather   . COPD Father    Social History  Substance Use  Topics  . Smoking status: Former Games developer  . Smokeless tobacco: Never Used  . Alcohol Use: No   OB History    No data available     Review of Systems  All other systems reviewed and are negative.     Allergies  Erythromycin and Penicillins  Home Medications   Prior to Admission medications   Medication Sig Start Date End Date Taking? Authorizing Provider  levothyroxine (SYNTHROID, LEVOTHROID) 200 MCG tablet Take 200 mcg by mouth daily before breakfast.   Yes Historical Provider, MD  buPROPion (WELLBUTRIN SR) 150 MG 12 hr tablet Take 1 tablet (150 mg total) by mouth 2 (two) times daily. 07/27/12 08/18/13  Gillian Scarce, MD  buPROPion (WELLBUTRIN XL) 300 MG 24 hr tablet Take 1 tablet (300 mg total) by mouth daily. 08/18/13 08/19/14  Gillian Scarce, MD  cyclobenzaprine (FLEXERIL) 10 MG tablet TAKE ONE TABLET BY MOUTH AT BEDTIME FOR MUSCLE SPASM    Gillian Scarce, MD  diclofenac sodium (VOLTAREN) 1 % GEL Apply 4 g topically 4 (four) times daily.    Historical Provider, MD  FLUoxetine (PROZAC) 40 MG capsule Take 1 capsule (40 mg total) by mouth daily. 08/18/13 08/18/14  Gillian Scarce, MD  Phentermine-Topiramate (QSYMIA) 7.5-46 MG CP24 Take 1 tablet by mouth daily. 08/18/13 08/19/14  Gillian Scarce, MD  ranitidine (ZANTAC) 150 MG tablet Take 1 tablet (150 mg total) by mouth 2 (two) times daily. 08/18/13 09/09/14  Gillian Scarce, MD   BP 146/89 mmHg  Pulse 99  Temp(Src) 98.5 F (36.9 C) (Oral)  Resp 18  Ht  (1.575 m)  Wt 77.111 kg  BMI 31.09 kg/m2  SpO2 100% Physical Exam  Constitutional: She appears well-developed and well-nourished. No distress.   Caucasian female appears uncomfortable.  HENT:  Head: Atraumatic.  Eyes: Conjunctivae are normal.  Neck: Neck supple.  Cardiovascular: Normal rate and regular rhythm.   Pulmonary/Chest: Effort normal and breath sounds normal. No respiratory distress. She has no wheezes. She has no rales. She exhibits no tenderness.  Abdominal: Soft.  There is tenderness ( tenderness to suprapubic region without guarding or rebound tenderness). There is no rebound and no guarding.   No CVA tenderness  Genitourinary:   Chaperone present during exam. Normal external genitalia free of lesion or rash. Extreme discomfort with speculum insertion , therefore examination is limited due to discomfort.  Neurological: She is alert.  Skin: No rash noted.  Psychiatric: She has a normal mood and affect.  Nursing note and vitals reviewed.   ED Course  Procedures (including critical care time) Labs Review Labs Reviewed  URINALYSIS, ROUTINE W REFLEX MICROSCOPIC (NOT AT [facility]) - Abnormal; Notable for the following:    Color, Urine RED (*)    APPearance CLOUDY (*)    Hgb urine dipstick TRACE (*)    Bilirubin Urine MODERATE (*)    Ketones, ur 15 (*)    Protein, ur 100 (*)    Nitrite POSITIVE (*)    Leukocytes, UA MODERATE (*)    All other components within normal limits  URINE MICROSCOPIC-ADD ON - Abnormal; Notable for the following:    Squamous Epithelial / LPF 0-5 (*)    Bacteria, UA FEW (*)    Casts HYALINE CASTS (*)    All other components within normal limits  PREGNANCY, URINE    Imaging Review No results found. I have personally reviewed and evaluated these images and lab results as part of my medical decision-making.   EKG Interpretation None      MDM   Final diagnoses:  UTI (lower urinary tract infection)    BP 146/89 mmHg  Pulse 99  Temp(Src) 98.5 F (36.9 C) (Oral)  Resp 18  Ht  (1.575 m)  Wt 77.111 kg  BMI 31.09 kg/m2  SpO2 100%    patient presents with dysuria including urinary frequency and urgency and burning urination. Urinalysis is positive for a urinary tract infection. Patient denies any sexual activities for the past few years, therefore we have no suspicion for STD causing her symptoms. Patient mentioned possible bladder prolapse in the past. Unable to fully visualize the vaginal vault on initial exam  due to discomfort. She has no upper abdominal pain to suggest gallbladder etiology  Or pancreatitis. She has no CVA tenderness to suggest  Kidney stones. Patient will be treated with Bactrim for her UTI/pyelo infection. Zofran given for her nausea.  10:25 PM  after taking Zofran, patient felt better she was able to keep the antibiotic down.   At this time patient felt comfortable going home. I encourage patient to stay hydrated, takes Zofran for her nausea, and finished her antibiotic for the full duration. She will follow-up with her PCP and her gynecologist for further management. Strict return precautions discussed including worsening nausea and vomiting or having increasing pain.   Fayrene Helper, PA-C 02/08/15 2227  Eber Hong, MD 02/08/15 2329

## 2015-02-08 NOTE — ED Notes (Signed)
Bladder scanned performed 26ml obtained after multiple attempts

## 2015-02-08 NOTE — ED Notes (Signed)
Patient states that she is having trouble urinating since last night. The patient reports that she is having pain to her lower pelvic region. Patient is having N/V

## 2020-09-16 ENCOUNTER — Emergency Department (HOSPITAL_BASED_OUTPATIENT_CLINIC_OR_DEPARTMENT_OTHER)
Admission: EM | Admit: 2020-09-16 | Discharge: 2020-09-16 | Disposition: A | Discharge status: Home / Self Care | Payer: BC Managed Care – PPO | Attending: Emergency Medicine | Admitting: Emergency Medicine

## 2020-09-16 ENCOUNTER — Encounter (HOSPITAL_BASED_OUTPATIENT_CLINIC_OR_DEPARTMENT_OTHER): Payer: Self-pay

## 2020-09-16 ENCOUNTER — Other Ambulatory Visit: Payer: Self-pay

## 2020-09-16 DIAGNOSIS — Z87891 Personal history of nicotine dependence: Secondary | ICD-10-CM | POA: Diagnosis not present

## 2020-09-16 DIAGNOSIS — Z86010 Personal history of colonic polyps: Secondary | ICD-10-CM | POA: Insufficient documentation

## 2020-09-16 DIAGNOSIS — U071 COVID-19: Secondary | ICD-10-CM | POA: Insufficient documentation

## 2020-09-16 DIAGNOSIS — G43909 Migraine, unspecified, not intractable, without status migrainosus: Secondary | ICD-10-CM | POA: Insufficient documentation

## 2020-09-16 DIAGNOSIS — Z79899 Other long term (current) drug therapy: Secondary | ICD-10-CM | POA: Diagnosis not present

## 2020-09-16 DIAGNOSIS — M7918 Myalgia, other site: Secondary | ICD-10-CM | POA: Diagnosis present

## 2020-09-16 DIAGNOSIS — E039 Hypothyroidism, unspecified: Secondary | ICD-10-CM | POA: Insufficient documentation

## 2020-09-16 HISTORY — DX: Meniere's disease, unspecified ear: H81.09

## 2020-09-16 HISTORY — DX: Migraine, unspecified, not intractable, without status migrainosus: G43.909

## 2020-09-16 LAB — CBC WITH DIFFERENTIAL/PLATELET
Abs Immature Granulocytes: 0.04 10*3/uL (ref 0.00–0.07)
Basophils Absolute: 0.1 10*3/uL (ref 0.0–0.1)
Basophils Relative: 1 %
Eosinophils Absolute: 0 10*3/uL (ref 0.0–0.5)
Eosinophils Relative: 0 %
HCT: 46.3 % — ABNORMAL HIGH (ref 36.0–46.0)
Hemoglobin: 16.1 g/dL — ABNORMAL HIGH (ref 12.0–15.0)
Immature Granulocytes: 1 %
Lymphocytes Relative: 25 %
Lymphs Abs: 1.5 10*3/uL (ref 0.7–4.0)
MCH: 31.6 pg (ref 26.0–34.0)
MCHC: 34.8 g/dL (ref 30.0–36.0)
MCV: 90.8 fL (ref 80.0–100.0)
Monocytes Absolute: 1 10*3/uL (ref 0.1–1.0)
Monocytes Relative: 16 %
Neutro Abs: 3.6 10*3/uL (ref 1.7–7.7)
Neutrophils Relative %: 57 %
Platelets: 293 10*3/uL (ref 150–400)
RBC: 5.1 MIL/uL (ref 3.87–5.11)
RDW: 11.8 % (ref 11.5–15.5)
WBC: 6.2 10*3/uL (ref 4.0–10.5)
nRBC: 0 % (ref 0.0–0.2)

## 2020-09-16 LAB — BASIC METABOLIC PANEL
Anion gap: 11 (ref 5–15)
BUN: 11 mg/dL (ref 8–23)
CO2: 23 mmol/L (ref 22–32)
Calcium: 9.1 mg/dL (ref 8.9–10.3)
Chloride: 101 mmol/L (ref 98–111)
Creatinine, Ser: 1.12 mg/dL — ABNORMAL HIGH (ref 0.44–1.00)
GFR, Estimated: 55 mL/min — ABNORMAL LOW (ref >60)
Glucose, Bld: 97 mg/dL (ref 70–99)
Potassium: 3.9 mmol/L (ref 3.5–5.1)
Sodium: 135 mmol/L (ref 135–145)

## 2020-09-16 LAB — RESP PANEL BY RT-PCR (FLU A&B, COVID) ARPGX2
Influenza A by PCR: NEGATIVE
Influenza B by PCR: NEGATIVE
SARS Coronavirus 2 by RT PCR: POSITIVE — AB

## 2020-09-16 MED ORDER — SODIUM CHLORIDE 0.9 % IV BOLUS
500.0000 mL | Freq: Once | INTRAVENOUS | Status: AC
  Administered 2020-09-16: 500 mL via INTRAVENOUS

## 2020-09-16 MED ORDER — KETOROLAC TROMETHAMINE 30 MG/ML IJ SOLN
30.0000 mg | Freq: Once | INTRAMUSCULAR | Status: AC
  Administered 2020-09-16: 30 mg via INTRAVENOUS
  Filled 2020-09-16: qty 1

## 2020-09-16 MED ORDER — ONDANSETRON HCL 4 MG/2ML IJ SOLN
4.0000 mg | Freq: Once | INTRAMUSCULAR | Status: AC
  Administered 2020-09-16: 4 mg via INTRAVENOUS
  Filled 2020-09-16: qty 2

## 2020-09-16 NOTE — Discharge Instructions (Addendum)
You have been evaluated in the Emergency Department. You are Covid positive.  You are to isolate and quarantine yourself.  Please refer to the information attached with this packet.  Treat yourself symptomatically with over the counter medication as you would for a viral illness.  Take Tylenol and Ibuprofen for pain and fever control. Stay well hydrated. If you have any worsening or severe symptoms, difficulty breathing, concern for your health please return to the emergency department. 

## 2020-09-16 NOTE — ED Triage Notes (Signed)
Pt c/o migraine x 3 days-fever/cough started today-NAD-steady gait

## 2020-09-16 NOTE — ED Provider Notes (Signed)
[facility] EMERGENCY DEPARTMENT Provider Note   CSN: [id number] Arrival date & time: 09/16/20  1732     History Chief Complaint  Patient presents with   Migraine    Jodi Jones is a 65 y.o. female.  HPI  65 year old female with past medical history of migraines, chronic fatigue presents the emergency department with migraine and body aches.  Patient states this migraine started 3 days ago.  This is a typical migraine for her.  No sudden onset, no associated neck pain or stiffness.  She took her own medications for migraine which did not improve and she developed whole body aches today.  She is had mild nausea but denies any vomiting, diarrhea, shortness of breath, cough.  Past Medical History:  Diagnosis Date   Cervical dysplasia    Chronic fatigue    Colon polyps    Depression    Fatigue    Insomnia    Meniere disease    Migraine    Osteoporosis    Thyroid disease     Patient Active Problem List   Diagnosis Date Noted   Obesity, unspecified 09/17/2013   Encounter for therapeutic drug monitoring 09/17/2013   Impaired fasting glucose 08/28/2012   Anxiety state, unspecified 08/28/2012   GERD (gastroesophageal reflux disease) 08/28/2012   Depressive disorder, not elsewhere classified 08/28/2012   Unspecified hypothyroidism 08/28/2012   Myalgia and myositis 08/28/2012   Other and unspecified hyperlipidemia 08/28/2012    Past Surgical History:  Procedure Laterality Date   DENTAL SURGERY     Bone Graft (2x)    LEEP       OB History   No obstetric history on file.     Family History  Problem Relation Age of Onset   Alzheimer's disease Mother    Alzheimer's disease Paternal Grandfather    COPD Father     Social History   Tobacco Use   Smoking status: Former   Smokeless tobacco: Never  Building services engineer Use: Never used  Substance Use Topics   Alcohol use: No   Drug use: No    Home Medications Prior to Admission medications    Medication Sig Start Date End Date Taking? Authorizing Provider  buPROPion (WELLBUTRIN SR) 150 MG 12 hr tablet Take 1 tablet (150 mg total) by mouth 2 (two) times daily. 07/27/12 08/18/13  Gillian Scarce, MD  buPROPion (WELLBUTRIN XL) 300 MG 24 hr tablet Take 1 tablet (300 mg total) by mouth daily. 08/18/13 08/19/14  Zanard, Hinton Dyer, MD  cyclobenzaprine (FLEXERIL) 10 MG tablet TAKE ONE TABLET BY MOUTH AT BEDTIME FOR MUSCLE SPASM    Zanard, Hinton Dyer, MD  diclofenac sodium (VOLTAREN) 1 % GEL Apply 4 g topically 4 (four) times daily.    [provider]  FLUoxetine (PROZAC) 40 MG capsule Take 1 capsule (40 mg total) by mouth daily. 08/18/13 08/18/14  Gillian Scarce, MD  levothyroxine (SYNTHROID, LEVOTHROID) 200 MCG tablet Take 200 mcg by mouth daily before breakfast.    [provider]  ondansetron (ZOFRAN) 4 MG tablet Take 1 tablet (4 mg total) by mouth every 6 (six) hours. 02/08/15   Fayrene Helper, PA-C  Phentermine-Topiramate (QSYMIA) 7.5-46 MG CP24 Take 1 tablet by mouth daily. 08/18/13 08/19/14  Gillian Scarce, MD  ranitidine (ZANTAC) 150 MG tablet Take 1 tablet (150 mg total) by mouth 2 (two) times daily. 08/18/13 09/09/14  Gillian Scarce, MD    Allergies    Erythromycin and Penicillins  Review of Systems   Review of Systems  Constitutional:  Positive for fatigue. Negative for chills and fever.  HENT:  Negative for congestion.   Eyes:  Negative for visual disturbance.  Respiratory:  Negative for shortness of breath.   Cardiovascular:  Negative for chest pain.  Gastrointestinal:  Positive for nausea. Negative for abdominal pain, diarrhea and vomiting.  Genitourinary:  Negative for dysuria.  Musculoskeletal:  Positive for arthralgias and myalgias. Negative for back pain, neck pain and neck stiffness.  Skin:  Negative for rash.  Neurological:  Positive for headaches.   Physical Exam Updated Vital Signs BP 134/68   Pulse 66   Temp (!) 100.6 F (38.1 C) (Oral)   Resp 15   Ht  5\' 2"  (1.575 m)   Wt 79.4 kg   SpO2 96%   BMI 32.01 kg/m   Physical Exam Vitals and nursing note reviewed.  Constitutional:      General: She is not in acute distress.    Appearance: Normal appearance. She is not ill-appearing.  HENT:     Head: Normocephalic.     Mouth/Throat:     Mouth: Mucous membranes are moist.  Eyes:     Pupils: Pupils are equal, round, and reactive to light.  Cardiovascular:     Rate and Rhythm: Normal rate.  Pulmonary:     Effort: Pulmonary effort is normal. No respiratory distress.  Abdominal:     Palpations: Abdomen is soft.     Tenderness: There is no abdominal tenderness.  Musculoskeletal:        General: No swelling.     Cervical back: No rigidity or tenderness.  Skin:    General: Skin is warm.  Neurological:     Mental Status: She is alert and oriented to person, place, and time. Mental status is at baseline.  Psychiatric:        Mood and Affect: Mood normal.    ED Results / Procedures / Treatments   Labs (all labs ordered are listed, but only abnormal results are displayed) Labs Reviewed  CBC WITH DIFFERENTIAL/PLATELET - Abnormal; Notable for the following components:      Result Value   Hemoglobin 16.1 (*)    HCT 46.3 (*)    All other components within normal limits  BASIC METABOLIC PANEL - Abnormal; Notable for the following components:   Creatinine, Ser 1.12 (*)    GFR, Estimated 55 (*)    All other components within normal limits  RESP PANEL BY RT-PCR (FLU A&B, COVID) ARPGX2    EKG None  Radiology No results found.  Procedures Procedures   Medications Ordered in ED Medications  ketorolac (TORADOL) 30 MG/ML injection 30 mg (30 mg Intravenous Given 09/16/20 2138)  ondansetron (ZOFRAN) injection 4 mg (4 mg Intravenous Given 09/16/20 2135)  sodium chloride 0.9 % bolus 500 mL (500 mLs Intravenous New Bag/Given 09/16/20 2133)    ED Course  I have reviewed the triage vital signs and the nursing notes.  Pertinent labs &  imaging results that were available during my care of the patient were reviewed by me and considered in my medical decision making (see chart for details).    MDM Rules/Calculators/A&P                           65 year old female presents the emergency department with migraine and body aches.  She is febrile on arrival.  This appears to be a typical migraine for her,  not acute, low suspicion for acute intracranial pathology.  Blood work is reassuring.  After symptomatic treatment the patient feels improved.  COVID swab is positive.  Educated the patient on COVID symptoms and treatment.  Do not believe she is a good candidate for paxlovid, symptoms are mild.  Patient at this time appears safe and stable for discharge and will be treated as an outpatient.  Discharge plan and strict return to ED precautions discussed, patient verbalizes understanding and agreement.   Final Clinical Impression(s) / ED Diagnoses Final diagnoses:  None    Rx / DC Orders ED Discharge Orders     None        Rozelle Logan, DO 09/16/20 2314

## 2020-09-16 NOTE — ED Notes (Signed)
Nausea improved.

## 2021-08-22 ENCOUNTER — Encounter (HOSPITAL_BASED_OUTPATIENT_CLINIC_OR_DEPARTMENT_OTHER): Payer: Self-pay | Admitting: Emergency Medicine

## 2021-08-22 ENCOUNTER — Emergency Department (HOSPITAL_BASED_OUTPATIENT_CLINIC_OR_DEPARTMENT_OTHER): Payer: BC Managed Care – PPO

## 2021-08-22 ENCOUNTER — Emergency Department (HOSPITAL_BASED_OUTPATIENT_CLINIC_OR_DEPARTMENT_OTHER)
Admission: EM | Admit: 2021-08-22 | Discharge: 2021-08-22 | Disposition: A | Discharge status: Home / Self Care | Payer: BC Managed Care – PPO | Attending: Emergency Medicine | Admitting: Emergency Medicine

## 2021-08-22 ENCOUNTER — Other Ambulatory Visit: Payer: Self-pay

## 2021-08-22 DIAGNOSIS — R202 Paresthesia of skin: Secondary | ICD-10-CM | POA: Insufficient documentation

## 2021-08-22 DIAGNOSIS — R519 Headache, unspecified: Secondary | ICD-10-CM | POA: Diagnosis not present

## 2021-08-22 LAB — BASIC METABOLIC PANEL
Anion gap: 6 (ref 5–15)
BUN: 10 mg/dL (ref 8–23)
CO2: 22 mmol/L (ref 22–32)
Calcium: 9.1 mg/dL (ref 8.9–10.3)
Chloride: 110 mmol/L (ref 98–111)
Creatinine, Ser: 0.92 mg/dL (ref 0.44–1.00)
GFR, Estimated: >60 mL/min (ref >60)
Glucose, Bld: 97 mg/dL (ref 70–99)
Potassium: 3.9 mmol/L (ref 3.5–5.1)
Sodium: 138 mmol/L (ref 135–145)

## 2021-08-22 LAB — CBC WITH DIFFERENTIAL/PLATELET
Abs Immature Granulocytes: 0.01 10*3/uL (ref 0.00–0.07)
Basophils Absolute: 0.1 10*3/uL (ref 0.0–0.1)
Basophils Relative: 1 %
Eosinophils Absolute: 0.2 10*3/uL (ref 0.0–0.5)
Eosinophils Relative: 2 %
HCT: 44.8 % (ref 36.0–46.0)
Hemoglobin: 15.5 g/dL — ABNORMAL HIGH (ref 12.0–15.0)
Immature Granulocytes: 0 %
Lymphocytes Relative: 44 %
Lymphs Abs: 4.1 10*3/uL — ABNORMAL HIGH (ref 0.7–4.0)
MCH: 30.5 pg (ref 26.0–34.0)
MCHC: 34.6 g/dL (ref 30.0–36.0)
MCV: 88 fL (ref 80.0–100.0)
Monocytes Absolute: 0.7 10*3/uL (ref 0.1–1.0)
Monocytes Relative: 8 %
Neutro Abs: 4.3 10*3/uL (ref 1.7–7.7)
Neutrophils Relative %: 45 %
Platelets: 378 10*3/uL (ref 150–400)
RBC: 5.09 MIL/uL (ref 3.87–5.11)
RDW: 11.7 % (ref 11.5–15.5)
WBC: 9.3 10*3/uL (ref 4.0–10.5)
nRBC: 0 % (ref 0.0–0.2)

## 2021-08-22 LAB — TROPONIN I (HIGH SENSITIVITY): Troponin I (High Sensitivity): 4 ng/L (ref <18)

## 2021-08-22 LAB — CBG MONITORING, ED: Glucose-Capillary: 94 mg/dL (ref 70–99)

## 2021-08-22 MED ORDER — IOHEXOL 350 MG/ML SOLN
100.0000 mL | Freq: Once | INTRAVENOUS | Status: AC | PRN
  Administered 2021-08-22: 100 mL via INTRAVENOUS

## 2021-08-22 MED ORDER — DIPHENHYDRAMINE HCL 50 MG/ML IJ SOLN
12.5000 mg | Freq: Once | INTRAMUSCULAR | Status: AC
  Administered 2021-08-22: 12.5 mg via INTRAVENOUS
  Filled 2021-08-22: qty 1

## 2021-08-22 MED ORDER — PROCHLORPERAZINE EDISYLATE 10 MG/2ML IJ SOLN
10.0000 mg | Freq: Once | INTRAMUSCULAR | Status: AC
  Administered 2021-08-22: 10 mg via INTRAVENOUS
  Filled 2021-08-22: qty 2

## 2021-08-22 MED ORDER — GABAPENTIN 300 MG PO CAPS: 300.0000 mg | ORAL_CAPSULE | Freq: Three times a day (TID) | ORAL | 0 refills | Status: AC | PRN

## 2021-08-22 MED ORDER — METOCLOPRAMIDE HCL 5 MG/ML IJ SOLN
5.0000 mg | Freq: Once | INTRAMUSCULAR | Status: AC
  Administered 2021-08-22: 5 mg via INTRAVENOUS
  Filled 2021-08-22: qty 2

## 2021-08-22 NOTE — ED Notes (Signed)
Patient transported to CT 

## 2021-08-22 NOTE — ED Provider Notes (Signed)
[facility] EMERGENCY DEPARTMENT Provider Note   CSN: [id number] Arrival date & time: 08/22/21  1940     History {Add pertinent medical, surgical, social history, OB history to HPI:1} Chief Complaint  Patient presents with   Numbness    Jodi Jones is a 66 y.o. female presenting to ED complaining of headache and left-sided facial paresthesias.  Patient reports has been having on and off sudden, sharp headaches, near the top of her head, that subsequently will become tingling on both sides of her face.  She experienced a similar episode around 6 PM this evening.  She said her husband came in the room and told her she looked "white as a sheet".  She reports she has had some left-sided facial paresthesia afterwards, feels some very mild discrepancy in sensation on the left side of her face compared to the right.  She continues to have the sharp pain on the top of her head.  This is been going on and off for about 2 weeks.  Denies numbness or weakness of the arms or legs.  HPI     Home Medications Prior to Admission medications   Medication Sig Start Date End Date Taking? Authorizing Provider  buPROPion (WELLBUTRIN SR) 150 MG 12 hr tablet Take 1 tablet (150 mg total) by mouth 2 (two) times daily. 07/27/12 08/18/13  Gillian Scarce, MD  buPROPion (WELLBUTRIN XL) 300 MG 24 hr tablet Take 1 tablet (300 mg total) by mouth daily. 08/18/13 08/19/14  Zanard, Hinton Dyer, MD  cyclobenzaprine (FLEXERIL) 10 MG tablet TAKE ONE TABLET BY MOUTH AT BEDTIME FOR MUSCLE SPASM    Zanard, Hinton Dyer, MD  diclofenac sodium (VOLTAREN) 1 % GEL Apply 4 g topically 4 (four) times daily.    [provider]  FLUoxetine (PROZAC) 40 MG capsule Take 1 capsule (40 mg total) by mouth daily. 08/18/13 08/18/14  Gillian Scarce, MD  levothyroxine (SYNTHROID, LEVOTHROID) 200 MCG tablet Take 200 mcg by mouth daily before breakfast.    [provider]  ondansetron (ZOFRAN) 4 MG tablet Take 1 tablet (4 mg  total) by mouth every 6 (six) hours. 02/08/15   Fayrene Helper, PA-C  Phentermine-Topiramate (QSYMIA) 7.5-46 MG CP24 Take 1 tablet by mouth daily. 08/18/13 08/19/14  Gillian Scarce, MD  ranitidine (ZANTAC) 150 MG tablet Take 1 tablet (150 mg total) by mouth 2 (two) times daily. 08/18/13 09/09/14  Gillian Scarce, MD      Allergies    Erythromycin and Penicillins    Review of Systems   Review of Systems  Physical Exam Updated Vital Signs BP 134/82 (BP Location: Right Arm)   Pulse 76   Temp 98.3 F (36.8 C) (Oral)   Resp 18   Ht 5\' 2"  (1.575 m)   SpO2 96%   BMI 32.01 kg/m  Physical Exam  ED Results / Procedures / Treatments   Labs (all labs ordered are listed, but only abnormal results are displayed) Labs Reviewed  BASIC METABOLIC PANEL  CBC WITH DIFFERENTIAL/PLATELET  CBG MONITORING, ED  TROPONIN I (HIGH SENSITIVITY)    EKG None  Radiology No results found.  Procedures Procedures  {Document cardiac monitor, telemetry assessment procedure when appropriate:1}  Medications Ordered in ED Medications - No data to display  ED Course/ Medical Decision Making/ A&P                           Medical Decision Making Amount and/or Complexity  of Data Reviewed Labs: ordered. Radiology: ordered. ECG/medicine tests: ordered.   ***  {Document critical care time when appropriate:1} {Document review of labs and clinical decision tools ie heart score, Chads2Vasc2 etc:1}  {Document your independent review of radiology images, and any outside records:1} {Document your discussion with family members, caretakers, and with consultants:1} {Document social determinants of health affecting pt's care:1} {Document your decision making why or why not admission, treatments were needed:1} Final Clinical Impression(s) / ED Diagnoses Final diagnoses:  None    Rx / DC Orders ED Discharge Orders     None

## 2021-08-22 NOTE — ED Triage Notes (Addendum)
Pt states she had symptoms of numbness and tingling on Sunday.  Saw MD on aug 1.  Pt symptoms came back at 6 pm tonight with head tingling and left sided facial numbness.  No arm weakness, no droop, no drift.  No aphasia.  Pt has recently started on new BP medication two weeks ago.

## 2021-08-22 NOTE — Discharge Instructions (Addendum)
Please follow-up with your neurologist this week as recommended.  Your symptoms may be related to a complex migraine, peripheral nerve inflammation, or another medical cause.  I prescribed gabapentin which you  can take as needed for pain at home.  If your pain is getting worse, you have persistent vomiting, difficulty speaking, vision changes, numbness or weakness in your arms or legs, or facial droop, or any other concerning strokelike symptoms, call 911 and return to the ER
